# Patient Record
Sex: Female | Born: 1990
Health system: Southern US, Community
[De-identification: ages and names within clinical notes are randomized; demographics above are authoritative.]

## PROBLEM LIST (undated history)

## (undated) ENCOUNTER — Inpatient Hospital Stay (HOSPITAL_COMMUNITY): Payer: Self-pay

## (undated) DIAGNOSIS — F32A Depression, unspecified: Secondary | ICD-10-CM

## (undated) DIAGNOSIS — E079 Disorder of thyroid, unspecified: Secondary | ICD-10-CM

## (undated) DIAGNOSIS — F329 Major depressive disorder, single episode, unspecified: Secondary | ICD-10-CM

## (undated) DIAGNOSIS — F411 Generalized anxiety disorder: Secondary | ICD-10-CM

## (undated) HISTORY — DX: Disorder of thyroid, unspecified: E07.9

## (undated) HISTORY — PX: WISDOM TOOTH EXTRACTION: SHX21

## (undated) HISTORY — DX: Generalized anxiety disorder: F41.1

## (undated) HISTORY — DX: Major depressive disorder, single episode, unspecified: F32.9

---
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
## 2011-01-16 ENCOUNTER — Encounter: Payer: Self-pay | Admitting: Internal Medicine

## 2011-01-17 ENCOUNTER — Encounter: Payer: Self-pay | Admitting: Internal Medicine

## 2011-01-17 ENCOUNTER — Ambulatory Visit (INDEPENDENT_AMBULATORY_CARE_PROVIDER_SITE_OTHER): Payer: 59 | Admitting: Internal Medicine

## 2011-01-17 VITALS — BP 118/74 | Temp 98.2°F | Ht 65.5 in | Wt 164.0 lb

## 2011-01-17 DIAGNOSIS — F411 Generalized anxiety disorder: Secondary | ICD-10-CM

## 2011-01-17 DIAGNOSIS — F329 Major depressive disorder, single episode, unspecified: Secondary | ICD-10-CM

## 2011-01-17 DIAGNOSIS — F3289 Other specified depressive episodes: Secondary | ICD-10-CM

## 2011-01-17 DIAGNOSIS — F410 Panic disorder [episodic paroxysmal anxiety] without agoraphobia: Secondary | ICD-10-CM

## 2011-01-17 MED ORDER — ALPRAZOLAM 0.25 MG PO TABS: 0.2500 mg | ORAL_TABLET | Freq: Three times a day (TID) | ORAL | Status: DC | PRN

## 2011-01-17 NOTE — Assessment & Plan Note (Signed)
Anxiety-patient has a chronic anxiety disorder and now episodes of a panic disorder.  Will continue her SSRI and refer for further psychiatric evaluation, treatment, and cognitive behavior modification.  Will place on short-acting Xanax until psychiatric assessment

## 2011-01-17 NOTE — Patient Instructions (Signed)
Follow behavioral health referral as discussed Call or return to clinic prn if these symptoms worsen or fail to improve as anticipated.

## 2011-01-17 NOTE — Progress Notes (Signed)
  Subjective:    Patient ID: Leah Perez, female    DOB: 01/25/1991, 20 y.o.   MRN: [id number]  Anxiety Symptoms include decreased concentration. Patient reports no confusion.      20 year old patient who is seen today for follow-up.  She has a history of anxiety, depression, and was evaluated and treated at the [facility] in 2008.  At that time, she had situational depression from the death of her best friend.  She was placed on and  Ambilify At that time, and had considerable weight gain.  She has a history of chronic anxiety, which has worsened over the past 3 to 4 weeks.  Three months ago.  She presented to the emergency department after an acute panic attack and her sertraline dose was increased to 100 mg daily.  At the present time.  She denies any depression.  She describes chronic anxiety and panic attacks usually occur about 3 o'clock in the morning and interfere with sleep.  She denies any situational stressors.  She does describe that time, racing thoughts, and inability to focus Review of Systems  Psychiatric/Behavioral: Positive for decreased concentration. Negative for hallucinations, behavioral problems, confusion, dysphoric mood and agitation.       Objective:   Physical Exam  Constitutional: She is oriented to person, place, and time. She appears well-developed and well-nourished. No distress.  Neurological: She is alert and oriented to person, place, and time.  Psychiatric: She has a normal mood and affect. Her behavior is normal. Judgment and thought content normal.       Very upbeat smiling hyperactive          Assessment & Plan:

## 2011-02-28 ENCOUNTER — Other Ambulatory Visit: Payer: Self-pay | Admitting: Internal Medicine

## 2011-04-19 ENCOUNTER — Telehealth: Payer: Self-pay | Admitting: Internal Medicine

## 2011-04-19 MED ORDER — ALPRAZOLAM 0.25 MG PO TABS: 0.2500 mg | ORAL_TABLET | Freq: Three times a day (TID) | ORAL | Status: DC | PRN

## 2011-04-19 MED ORDER — SERTRALINE HCL 50 MG PO TABS: 50.0000 mg | ORAL_TABLET | Freq: Every day | ORAL | Status: DC

## 2011-04-19 NOTE — Telephone Encounter (Signed)
Pt out of sertraline (ZOLOFT) 50 MG tablet and Alprazolam .25 mg. Pt is req these be called in to [facility] on [address]. Pt said that pharmacy has already sent a refill req on both of these and have not rcvd response.

## 2011-07-01 ENCOUNTER — Ambulatory Visit: Payer: 59 | Admitting: Internal Medicine

## 2011-07-02 ENCOUNTER — Encounter: Payer: Self-pay | Admitting: Internal Medicine

## 2011-07-02 ENCOUNTER — Ambulatory Visit (INDEPENDENT_AMBULATORY_CARE_PROVIDER_SITE_OTHER): Payer: 59 | Admitting: Internal Medicine

## 2011-07-02 DIAGNOSIS — Z8669 Personal history of other diseases of the nervous system and sense organs: Secondary | ICD-10-CM

## 2011-07-02 DIAGNOSIS — F411 Generalized anxiety disorder: Secondary | ICD-10-CM

## 2011-07-02 MED ORDER — ALPRAZOLAM 0.25 MG PO TABS: 0.2500 mg | ORAL_TABLET | Freq: Three times a day (TID) | ORAL | Status: DC | PRN

## 2011-07-02 MED ORDER — SUMATRIPTAN SUCCINATE 100 MG PO TABS: 100.0000 mg | ORAL_TABLET | Freq: Once | ORAL | Status: DC | PRN

## 2011-07-02 MED ORDER — SERTRALINE HCL 100 MG PO TABS: 100.0000 mg | ORAL_TABLET | Freq: Every day | ORAL | Status: DC

## 2011-07-02 NOTE — Progress Notes (Signed)
  Subjective:    Patient ID: Leah Perez, female    DOB: 07-11-1991, 20 y.o.   MRN: [id number]  HPI  20 year old patient who presents with a month and a half history of worsening headaches. She describes severe occipital headaches that become more generalized to the frontal area they're described as a severe throbbing sensation grade 7/10 pain scale. They're associated with nausea vomiting photophobia and phonophobia. She states they often last 2-3 days. No prior history of migraine headaches she does admit to increased situational stress especially work related Review of Systems  Constitutional: Negative.   HENT: Negative for hearing loss, congestion, sore throat, rhinorrhea, dental problem, sinus pressure and tinnitus.   Eyes: Negative for pain, discharge and visual disturbance.  Respiratory: Negative for cough and shortness of breath.   Cardiovascular: Negative for chest pain, palpitations and leg swelling.  Gastrointestinal: Positive for nausea and vomiting. Negative for abdominal pain, diarrhea, constipation, blood in stool and abdominal distention.  Genitourinary: Negative for dysuria, urgency, frequency, hematuria, flank pain, vaginal bleeding, vaginal discharge, difficulty urinating, vaginal pain and pelvic pain.  Musculoskeletal: Negative for joint swelling, arthralgias and gait problem.  Skin: Negative for rash.  Neurological: Positive for headaches. Negative for dizziness, syncope, speech difficulty, weakness and numbness.  Hematological: Negative for adenopathy.  Psychiatric/Behavioral: Negative for behavioral problems, dysphoric mood and agitation. The patient is not nervous/anxious.        Objective:   Physical Exam  Constitutional: She is oriented to person, place, and time. She appears well-developed and well-nourished. No distress.  HENT:  Head: Normocephalic.  Right Ear: External ear normal.  Mouth/Throat: Oropharynx is clear and moist.  Eyes: Conjunctivae are normal.  Pupils are equal, round, and reactive to light.       Fundi normal  Neurological: She is alert and oriented to person, place, and time. She has normal reflexes. No cranial nerve deficit.          Assessment & Plan:   Migraine headaches. Will give a trial of Imitrex. We'll increase her sertraline to 100 mg daily. Will recheck in 4 weeks. Stress management encouraged she'll consider behavioral health referral

## 2011-07-02 NOTE — Patient Instructions (Signed)
Return in one month for follow-up  

## 2011-08-05 ENCOUNTER — Ambulatory Visit: Payer: 59 | Admitting: Internal Medicine

## 2011-08-07 ENCOUNTER — Ambulatory Visit (INDEPENDENT_AMBULATORY_CARE_PROVIDER_SITE_OTHER): Payer: 59 | Admitting: Internal Medicine

## 2011-08-07 ENCOUNTER — Encounter: Payer: Self-pay | Admitting: Internal Medicine

## 2011-08-07 DIAGNOSIS — F329 Major depressive disorder, single episode, unspecified: Secondary | ICD-10-CM

## 2011-08-07 DIAGNOSIS — Z8669 Personal history of other diseases of the nervous system and sense organs: Secondary | ICD-10-CM

## 2011-08-07 MED ORDER — FLUTICASONE PROPIONATE 50 MCG/ACT NA SUSP: 1.0000 | Freq: Every day | NASAL | Status: DC

## 2011-08-07 NOTE — Patient Instructions (Addendum)
Call or return to clinic prn if these symptoms worsen or fail to improve as anticipated.  These nasal spray as directed  Consider Alavert Allegra Zyrtec  daily for allergy-related symptoms

## 2011-08-07 NOTE — Progress Notes (Signed)
  Subjective:    Patient ID: Leah Perez, female    DOB: 04-30-1991, 20 y.o.   MRN: [id number]  HPI  20 year old patient who is seen today for followup. She was seen one month ago with worsening migraine headaches as well as worsening stress and anxiety. She is receiving counseling and has done much better with her stress. Her headaches have dramatically improved. She presently is on a sertraline dose of 100 mg. She is responding very nicely to Imitrex which she has taken much earlier in the headache interval and doing quite well. She is extremely pleased with her current status. She is having some allergic rhinitis type symptoms but otherwise doing quite well    Review of Systems  HENT: Positive for congestion and rhinorrhea.   Neurological: Positive for headaches.       Objective:   Physical Exam  Constitutional: She appears well-developed and well-nourished. No distress.       Pleasant no acute distress  Psychiatric: Her behavior is normal.          Assessment & Plan:   Migraine headaches much improved we'll continue present regimen Situational stress also much improved we'll continue present regimen and counseling which has been extremely helpful will return here when necessary

## 2011-08-08 ENCOUNTER — Other Ambulatory Visit: Payer: Self-pay | Admitting: Internal Medicine

## 2011-08-08 NOTE — Telephone Encounter (Signed)
Pt was in 8/29. States Dr. Kirtland Bouchard was to refill all her meds, but he did not. She is leaving - must be at airport at 10 and needs all of them called in to [facility] on [address].

## 2011-08-08 NOTE — Telephone Encounter (Signed)
Spoke with pt - pharmacy not open till 9am - should have refill on all meds - if not - have [facility] call and I will ok . If not , locate pharmacy in [place] and I cal call refilled in there . KIK

## 2011-08-13 ENCOUNTER — Other Ambulatory Visit: Payer: Self-pay | Admitting: Internal Medicine

## 2011-10-25 ENCOUNTER — Ambulatory Visit (INDEPENDENT_AMBULATORY_CARE_PROVIDER_SITE_OTHER): Payer: 59 | Admitting: Internal Medicine

## 2011-10-25 ENCOUNTER — Encounter: Payer: Self-pay | Admitting: Internal Medicine

## 2011-10-25 VITALS — BP 110/78 | Temp 97.8°F | Wt 170.0 lb

## 2011-10-25 DIAGNOSIS — F329 Major depressive disorder, single episode, unspecified: Secondary | ICD-10-CM

## 2011-10-25 DIAGNOSIS — F411 Generalized anxiety disorder: Secondary | ICD-10-CM

## 2011-10-25 DIAGNOSIS — Z8669 Personal history of other diseases of the nervous system and sense organs: Secondary | ICD-10-CM

## 2011-10-25 DIAGNOSIS — Z0000 Encounter for general adult medical examination without abnormal findings: Secondary | ICD-10-CM

## 2011-10-25 DIAGNOSIS — F3289 Other specified depressive episodes: Secondary | ICD-10-CM

## 2011-10-25 DIAGNOSIS — Z23 Encounter for immunization: Secondary | ICD-10-CM

## 2011-10-25 MED ORDER — ALPRAZOLAM 0.5 MG PO TABS: 0.5000 mg | ORAL_TABLET | Freq: Three times a day (TID) | ORAL | Status: DC | PRN

## 2011-10-25 NOTE — Progress Notes (Signed)
  Subjective:    Patient ID: Leah Perez, female    DOB: 10/14/1991, 20 y.o.   MRN: [id number]  HPI  20 year old patient who has a history of anxiety depression. She has been followed at the [facility] and has done quite well. Her psychologist feels that she might benefit from a slightly higher dose of alprazolam. She takes this rarely only twice per week. She is on sertraline 100 mg daily and feels that she is doing quite well as far as her anxiety stress and depression. Her migraines and also decreased dramatically in frequency and she is quite pleased with the progress there.    Review of Systems  Neurological: Positive for headaches (improved).  Psychiatric/Behavioral: Positive for dysphoric mood (improved). The patient is nervous/anxious ( improved).        Objective:   Physical Exam  Constitutional: She appears well-developed and well-nourished. No distress.  Psychiatric: She has a normal mood and affect. Her behavior is normal. Judgment and thought content normal.          Assessment & Plan:

## 2011-10-25 NOTE — Patient Instructions (Signed)
It is important that you exercise regularly, at least 20 minutes 3 to 4 times per week.  If you develop chest pain or shortness of breath seek  medical attention.  Return in 6 months for follow-up  

## 2011-12-15 ENCOUNTER — Emergency Department (HOSPITAL_COMMUNITY)
Admission: EM | Admit: 2011-12-15 | Discharge: 2011-12-15 | Disposition: A | Discharge status: Home / Self Care | Payer: 59 | Attending: Emergency Medicine | Admitting: Emergency Medicine

## 2011-12-15 DIAGNOSIS — R059 Cough, unspecified: Secondary | ICD-10-CM | POA: Insufficient documentation

## 2011-12-15 DIAGNOSIS — Z79899 Other long term (current) drug therapy: Secondary | ICD-10-CM | POA: Insufficient documentation

## 2011-12-15 DIAGNOSIS — H5710 Ocular pain, unspecified eye: Secondary | ICD-10-CM | POA: Insufficient documentation

## 2011-12-15 DIAGNOSIS — IMO0001 Reserved for inherently not codable concepts without codable children: Secondary | ICD-10-CM | POA: Insufficient documentation

## 2011-12-15 DIAGNOSIS — R5381 Other malaise: Secondary | ICD-10-CM | POA: Insufficient documentation

## 2011-12-15 DIAGNOSIS — R0982 Postnasal drip: Secondary | ICD-10-CM | POA: Insufficient documentation

## 2011-12-15 DIAGNOSIS — R112 Nausea with vomiting, unspecified: Secondary | ICD-10-CM | POA: Insufficient documentation

## 2011-12-15 DIAGNOSIS — H9209 Otalgia, unspecified ear: Secondary | ICD-10-CM | POA: Insufficient documentation

## 2011-12-15 DIAGNOSIS — R509 Fever, unspecified: Secondary | ICD-10-CM | POA: Insufficient documentation

## 2011-12-15 DIAGNOSIS — F17200 Nicotine dependence, unspecified, uncomplicated: Secondary | ICD-10-CM | POA: Insufficient documentation

## 2011-12-15 DIAGNOSIS — J329 Chronic sinusitis, unspecified: Secondary | ICD-10-CM | POA: Insufficient documentation

## 2011-12-15 DIAGNOSIS — R070 Pain in throat: Secondary | ICD-10-CM | POA: Insufficient documentation

## 2011-12-15 DIAGNOSIS — R05 Cough: Secondary | ICD-10-CM | POA: Insufficient documentation

## 2011-12-15 DIAGNOSIS — R51 Headache: Secondary | ICD-10-CM | POA: Insufficient documentation

## 2011-12-15 MED ORDER — CETIRIZINE-PSEUDOEPHEDRINE ER 5-120 MG PO TB12: 1.0000 | ORAL_TABLET | Freq: Every day | ORAL | Status: DC

## 2011-12-15 MED ORDER — PROMETHAZINE HCL 25 MG/ML IJ SOLN
25.0000 mg | Freq: Once | INTRAMUSCULAR | Status: AC
  Administered 2011-12-15: 25 mg via INTRAMUSCULAR
  Filled 2011-12-15: qty 1

## 2011-12-15 MED ORDER — OXYMETAZOLINE HCL 0.05 % NA SOLN
2.0000 | Freq: Once | NASAL | Status: AC
  Administered 2011-12-15: 2 via NASAL
  Filled 2011-12-15: qty 15

## 2011-12-15 MED ORDER — KETOROLAC TROMETHAMINE 60 MG/2ML IM SOLN
60.0000 mg | Freq: Once | INTRAMUSCULAR | Status: AC
  Administered 2011-12-15: 60 mg via INTRAMUSCULAR
  Filled 2011-12-15: qty 2

## 2011-12-15 NOTE — ED Notes (Signed)
Pt is able to keep down food and fluids, denies fever but states has the chills at times. Pt states her whole body just aches and hurts really bad. Pt in no distress.

## 2011-12-15 NOTE — ED Notes (Signed)
Pt in with c/o headache body aches and flu-like sx x1 week pt states a hx of migraines and no relief with otc medications also c/o vomiting with nausea

## 2011-12-15 NOTE — ED Provider Notes (Signed)
History     CSN: [id number]  Arrival date & time 12/15/11  1304   First MD Initiated Contact with Patient 12/15/11 1506      Chief Complaint  Patient presents with  . Migraine    (Consider location/radiation/quality/duration/timing/severity/associated sxs/prior treatment) Patient is a 21 y.o. female presenting with URI. The history is provided by the patient.  URI The primary symptoms include fever, fatigue, headaches, ear pain, cough, nausea, vomiting and myalgias. Primary symptoms do not include sore throat, abdominal pain or rash. The current episode started 6 to 7 days ago. This is a new problem. The problem has been gradually worsening.  The fever began 6 to 7 days ago. The fever has been gradually improving since its onset. The maximum temperature recorded prior to her arrival was unknown.  The headache began more than 2 days ago. The pain from the headache is at a severity of 8/10. The headache is associated with eye pain. The headache is not associated with photophobia, double vision, decreased vision, stiff neck, neck stiffness or weakness.  The myalgias are not associated with weakness.   Symptoms associated with the illness include chills and congestion.  Pt states it started as nasal congestion, fever, body aches, sore throat, cough. States now everything improving except for sinus pressure, nasal congestion, headache, myalgias. States feels like these symptoms are worsening. She is unable to sleep at night. States has been taking zyrtec, Claritin, dayquil, nyquil, states not improving. Denies neck pain or stiffness. Denies chest pain, abdominal pain, rash.  Past Medical History  Diagnosis Date  . Migraine     History reviewed. No pertinent past surgical history.  No family history on file.  History  Substance Use Topics  . Smoking status: Current Some Day Smoker  . Smokeless tobacco: Not on file  . Alcohol Use: Yes    OB History    Grav Para Term Preterm  Abortions TAB SAB Ect Mult Living                  Review of Systems  Constitutional: Positive for fever, chills and fatigue.  HENT: Positive for ear pain, congestion and postnasal drip. Negative for sore throat, neck pain and neck stiffness.   Eyes: Positive for pain. Negative for double vision and photophobia.  Respiratory: Positive for cough.   Cardiovascular: Negative.   Gastrointestinal: Positive for nausea and vomiting. Negative for abdominal pain.  Genitourinary: Negative.   Musculoskeletal: Positive for myalgias.  Skin: Negative for rash.  Neurological: Positive for headaches. Negative for weakness.  Psychiatric/Behavioral: Negative.     Allergies  Review of patient's allergies indicates no known allergies.  Home Medications   Current Outpatient Rx  Name Route Sig Dispense Refill  . ALPRAZOLAM 0.5 MG PO TABS Oral Take 0.5 mg by mouth 3 (three) times daily as needed. For anxiety.     Marland Kitchen VITAMIN C PO Oral Take 1 tablet by mouth daily.      Marland Kitchen VITAMIN D 1000 UNITS PO TABS Oral Take 1,000 Units by mouth daily.      . NYQUIL PO Oral Take 2 capsules by mouth every 4 (four) hours as needed. For cold symptoms.     . DAYQUIL PO Oral Take 2 capsules by mouth every 4 (four) hours as needed. For cold symptoms.     . SERTRALINE HCL 100 MG PO TABS Oral Take 100 mg by mouth daily.      . SUMATRIPTAN SUCCINATE 100 MG PO TABS Oral Take 100  mg by mouth every 2 (two) hours as needed.        BP 126/77  Pulse 91  Temp(Src) 98 F (36.7 C) (Oral)  Resp 18  SpO2 100%  LMP 12/08/2011  Physical Exam  Nursing note and vitals reviewed. Constitutional: She is oriented to person, place, and time. She appears well-developed and well-nourished. No distress.  HENT:  Head: Normocephalic.  Right Ear: Hearing, external ear and ear canal normal. No tenderness. Tympanic membrane is not erythematous.  Left Ear: Hearing, external ear and ear canal normal. No tenderness. Tympanic membrane is not  erythematous.  Nose: Rhinorrhea present. Right sinus exhibits maxillary sinus tenderness and frontal sinus tenderness. Left sinus exhibits maxillary sinus tenderness and frontal sinus tenderness.  Mouth/Throat: Uvula is midline, oropharynx is clear and moist and mucous membranes are normal. No oropharyngeal exudate or posterior oropharyngeal erythema.       Fluid behind TM bilaterally  Eyes: Conjunctivae are normal.  Neck: Normal range of motion. Neck supple.       No meningismus  Cardiovascular: Normal rate, regular rhythm and normal heart sounds.   Pulmonary/Chest: Effort normal and breath sounds normal. No respiratory distress.  Abdominal: Soft. Bowel sounds are normal. There is no tenderness.  Musculoskeletal: Normal range of motion.  Neurological: She is alert and oriented to person, place, and time.  Skin: Skin is warm and dry. No erythema.  Psychiatric: She has a normal mood and affect.    ED Course  Procedures (including critical care time)  Pt with URI symptoms, congestion. States triggered her migraine. VS normal. Pt in NAD. Will try toradol, phenergan. Pt is well appearing, non toxic. Suspect headache is actually from sinus pressure. Will try decongestants, and follow up  4:10 PM Pt slightly improved with toradol and phenergan. Will d/c home with PCP follow up.  MDM          Lottie Mussel, PA 12/15/11 [id number]

## 2011-12-16 NOTE — ED Provider Notes (Signed)
Medical screening examination/treatment/procedure(s) were performed by non-physician practitioner and as supervising physician I was immediately available for consultation/collaboration.  Karey Suthers, MD 12/16/11 0119 

## 2012-01-09 ENCOUNTER — Telehealth: Payer: Self-pay | Admitting: Internal Medicine

## 2012-01-09 MED ORDER — SERTRALINE HCL 100 MG PO TABS: 100.0000 mg | ORAL_TABLET | Freq: Every day | ORAL | Status: DC

## 2012-01-09 NOTE — Telephone Encounter (Signed)
ok 

## 2012-01-09 NOTE — Telephone Encounter (Signed)
Pt last seen 10/25/2011. Pls advise.

## 2012-01-09 NOTE — Telephone Encounter (Signed)
Rx sent to pharmacy.  Left a message for pt to return call.  

## 2012-01-09 NOTE — Telephone Encounter (Signed)
Pt is req a partial refill of sertraline (ZOLOFT) 100 MG tablet #16 to last until her refill on 01/25/12 is due. Pt said that she lost her medication.

## 2012-01-27 ENCOUNTER — Other Ambulatory Visit: Payer: Self-pay | Admitting: Internal Medicine

## 2012-01-27 NOTE — Telephone Encounter (Signed)
Called in.

## 2012-01-27 NOTE — Telephone Encounter (Signed)
Last seen 10/25/11  Last written 10/25/11  # [phone] RF  Please advise

## 2012-01-27 NOTE — Telephone Encounter (Signed)
ok 

## 2012-02-04 ENCOUNTER — Encounter: Payer: Self-pay | Admitting: Internal Medicine

## 2012-02-04 ENCOUNTER — Ambulatory Visit (INDEPENDENT_AMBULATORY_CARE_PROVIDER_SITE_OTHER): Payer: 59 | Admitting: Internal Medicine

## 2012-02-04 VITALS — BP 100/62 | Temp 98.6°F | Wt 163.0 lb

## 2012-02-04 DIAGNOSIS — J069 Acute upper respiratory infection, unspecified: Secondary | ICD-10-CM

## 2012-02-04 MED ORDER — HYDROCODONE-HOMATROPINE 5-1.5 MG/5ML PO SYRP: 5.0000 mL | ORAL_SOLUTION | Freq: Four times a day (QID) | ORAL | Status: AC | PRN

## 2012-02-04 NOTE — Patient Instructions (Signed)
Get plenty of rest, Drink lots of  clear liquids, and use Tylenol or ibuprofen for fever and discomfort.    Mucinex DM  one twice daily  Call or return to clinic prn if these symptoms worsen or fail to improve as anticipated.

## 2012-02-04 NOTE — Progress Notes (Signed)
  Subjective:    Patient ID: Leah Perez, female    DOB: [birth year], 21 y.o.   MRN: [id number]  HPI  21 year old patient who has a history of allergic rhinitis she successfully discontinued tobacco use approximately one month ago. For the past week she has had increasing cough which has been non-productive. Cough has been refractory and interfering with sleep. She has developed some chest wall discomfort. There's been no wheezing fever or sputum production    Review of Systems  Respiratory: Positive for cough and chest tightness.        Objective:   Physical Exam  Constitutional: She is oriented to person, place, and time. She appears well-developed and well-nourished.  HENT:  Head: Normocephalic.  Right Ear: External ear normal.  Left Ear: External ear normal.  Mouth/Throat: Oropharynx is clear and moist.  Eyes: Conjunctivae and EOM are normal. Pupils are equal, round, and reactive to light.  Neck: Normal range of motion. Neck supple. No thyromegaly present.  Cardiovascular: Normal rate, regular rhythm, normal heart sounds and intact distal pulses.   Pulmonary/Chest: Effort normal and breath sounds normal. No respiratory distress. She has no wheezes. She has no rales. She exhibits no tenderness.  Abdominal: Soft. Bowel sounds are normal. She exhibits no mass. There is no tenderness.  Musculoskeletal: Normal range of motion.  Lymphadenopathy:    She has no cervical adenopathy.  Neurological: She is alert and oriented to person, place, and time.  Skin: Skin is warm and dry. No rash noted.  Psychiatric: She has a normal mood and affect. Her behavior is normal.          Assessment & Plan:    Viral URI with cough. We'll treat symptomatically We'll call if not improved

## 2012-03-23 ENCOUNTER — Encounter: Payer: Self-pay | Admitting: Internal Medicine

## 2012-03-23 ENCOUNTER — Ambulatory Visit: Payer: Self-pay | Admitting: Internal Medicine

## 2012-03-23 ENCOUNTER — Ambulatory Visit (INDEPENDENT_AMBULATORY_CARE_PROVIDER_SITE_OTHER): Payer: 59 | Admitting: Internal Medicine

## 2012-03-23 VITALS — BP 120/86 | Temp 97.7°F | Wt 159.0 lb

## 2012-03-23 DIAGNOSIS — F411 Generalized anxiety disorder: Secondary | ICD-10-CM

## 2012-03-23 DIAGNOSIS — F329 Major depressive disorder, single episode, unspecified: Secondary | ICD-10-CM

## 2012-03-23 NOTE — Patient Instructions (Signed)
Behavioral health referral as discussed  Return in 3 months

## 2012-03-23 NOTE — Progress Notes (Signed)
  Subjective:    Patient ID: Leah Perez, female    DOB: [birth year], 21 y.o.   MRN: [id number]  HPI 21 year old patient who has a history of anxiety depression. She has done quite well on sertraline and when necessary alprazolam.  More recently she has had significant situational stress and has become quite overwhelmed.  She has been much more stressed and anxious and feels that she is at her breaking point. No suicide ideation.    Review of Systems  Psychiatric/Behavioral: Positive for sleep disturbance and decreased concentration. The patient is nervous/anxious.        Objective:   Physical Exam  Constitutional: She appears well-developed and well-nourished. No distress.  Psychiatric: Judgment and thought content normal.       Quite anxious          Assessment & Plan:   Anxiety depression Situational stress  We'll set up for CBT and continue present regimen

## 2012-04-02 ENCOUNTER — Other Ambulatory Visit: Payer: Self-pay | Admitting: Internal Medicine

## 2012-04-15 ENCOUNTER — Ambulatory Visit (INDEPENDENT_AMBULATORY_CARE_PROVIDER_SITE_OTHER): Payer: 59 | Admitting: Internal Medicine

## 2012-04-15 ENCOUNTER — Encounter: Payer: Self-pay | Admitting: Internal Medicine

## 2012-04-15 VITALS — BP 124/84 | Temp 98.7°F | Wt 156.0 lb

## 2012-04-15 DIAGNOSIS — F909 Attention-deficit hyperactivity disorder, unspecified type: Secondary | ICD-10-CM

## 2012-04-15 MED ORDER — LISDEXAMFETAMINE DIMESYLATE 50 MG PO CAPS: 50.0000 mg | ORAL_CAPSULE | ORAL | Status: DC

## 2012-04-15 MED ORDER — AMPHETAMINE-DEXTROAMPHETAMINE 10 MG PO TABS: 10.0000 mg | ORAL_TABLET | Freq: Every day | ORAL | Status: DC

## 2012-04-15 MED ORDER — AMPHETAMINE-DEXTROAMPHET ER 20 MG PO CP24: 20.0000 mg | ORAL_CAPSULE | ORAL | Status: DC

## 2012-04-15 NOTE — Patient Instructions (Signed)
Return in one month for follow-up  

## 2012-04-15 NOTE — Progress Notes (Signed)
  Subjective:    Patient ID: Leah Perez, female    DOB: [birth year], 21 y.o.   MRN: [id number]  HPI  21 year old patient who has a long history of ADD which he states was diagnosed in third grade. Her mother has been resistant to medications but more recently the patient has felt quite impaired with her job with decreased focus and easy distractibility. She did use a friend's Vyvanse 50 and had a very traumatic and good clinical response    Review of Systems  Psychiatric/Behavioral: Positive for decreased concentration.       Objective:   Physical Exam  Constitutional: She appears well-developed and well-nourished. No distress.  Psychiatric: She has a normal mood and affect. Her behavior is normal. Judgment and thought content normal.          Assessment & Plan:   ADHD. We'll give a prescription for both Vyvanse as well as generic Adderall which she will price. She will return in 4 weeks for followup. She has been asked to read "driven to distraction".

## 2012-04-24 ENCOUNTER — Other Ambulatory Visit: Payer: Self-pay | Admitting: Internal Medicine

## 2012-05-20 ENCOUNTER — Other Ambulatory Visit: Payer: Self-pay | Admitting: Internal Medicine

## 2012-05-21 ENCOUNTER — Telehealth: Payer: Self-pay | Admitting: Internal Medicine

## 2012-05-21 ENCOUNTER — Other Ambulatory Visit: Payer: Self-pay | Admitting: Internal Medicine

## 2012-05-21 MED ORDER — AMPHETAMINE-DEXTROAMPHET ER 20 MG PO CP24: 20.0000 mg | ORAL_CAPSULE | ORAL | Status: DC

## 2012-05-21 MED ORDER — AMPHETAMINE-DEXTROAMPHETAMINE 10 MG PO TABS: 10.0000 mg | ORAL_TABLET | Freq: Every day | ORAL | Status: DC

## 2012-05-21 NOTE — Telephone Encounter (Signed)
rx's done and ready for pick up

## 2012-05-21 NOTE — Telephone Encounter (Signed)
You had only give 30 day rx x1 on both strengths in may when into be seen for focus issues - please advise

## 2012-05-21 NOTE — Telephone Encounter (Signed)
Pt called req refills for amphetamine-dextroamphetamine (ADDERALL XR) 20 MG 24 hr capsule and amphetamine-dextroamphetamine (ADDERALL, 10MG ,) 10 MG tablet.

## 2012-05-21 NOTE — Telephone Encounter (Signed)
Pt had called and req a refill of amphetamine-dextroamphetamine (ADDERALL XR) 20 MG 24 hr capsule and amphetamine-dextroamphetamine (ADDERALL, 10MG ,) 10 MG tablet this morning.

## 2012-05-21 NOTE — Telephone Encounter (Signed)
ok 

## 2012-05-21 NOTE — Telephone Encounter (Signed)
Called number given - a man ans. - LMTCB   Called cell# listed in chart - VM - LMTCB need to now what to refills - has 2 different adderalls and 1 vyvansne listed- want to make sure to fill what she needs

## 2012-06-24 ENCOUNTER — Telehealth: Payer: Self-pay | Admitting: Internal Medicine

## 2012-06-24 MED ORDER — ALPRAZOLAM 0.5 MG PO TABS: 0.5000 mg | ORAL_TABLET | Freq: Three times a day (TID) | ORAL | Status: DC | PRN

## 2012-06-24 NOTE — Telephone Encounter (Signed)
Pt requesting refill on ALPRAZolam (XANAX) 0.5 MG tablet  ° °

## 2012-07-15 ENCOUNTER — Emergency Department (HOSPITAL_COMMUNITY): Payer: No Typology Code available for payment source

## 2012-07-15 ENCOUNTER — Encounter (HOSPITAL_COMMUNITY): Payer: Self-pay | Admitting: Emergency Medicine

## 2012-07-15 ENCOUNTER — Emergency Department (HOSPITAL_COMMUNITY)
Admission: EM | Admit: 2012-07-15 | Discharge: 2012-07-15 | Disposition: A | Discharge status: Home / Self Care | Payer: No Typology Code available for payment source | Attending: Emergency Medicine | Admitting: Emergency Medicine

## 2012-07-15 DIAGNOSIS — M542 Cervicalgia: Secondary | ICD-10-CM | POA: Insufficient documentation

## 2012-07-15 DIAGNOSIS — R079 Chest pain, unspecified: Secondary | ICD-10-CM | POA: Insufficient documentation

## 2012-07-15 DIAGNOSIS — T1490XA Injury, unspecified, initial encounter: Secondary | ICD-10-CM | POA: Insufficient documentation

## 2012-07-15 DIAGNOSIS — R51 Headache: Secondary | ICD-10-CM | POA: Insufficient documentation

## 2012-07-15 DIAGNOSIS — M25559 Pain in unspecified hip: Secondary | ICD-10-CM | POA: Insufficient documentation

## 2012-07-15 DIAGNOSIS — M25569 Pain in unspecified knee: Secondary | ICD-10-CM | POA: Insufficient documentation

## 2012-07-15 HISTORY — DX: Depression, unspecified: F32.A

## 2012-07-15 HISTORY — DX: Major depressive disorder, single episode, unspecified: F32.9

## 2012-07-15 LAB — URINALYSIS, ROUTINE W REFLEX MICROSCOPIC
Glucose, UA: NEGATIVE mg/dL
Ketones, ur: NEGATIVE mg/dL
Protein, ur: NEGATIVE mg/dL

## 2012-07-15 LAB — URINE MICROSCOPIC-ADD ON

## 2012-07-15 MED ORDER — DIAZEPAM 5 MG PO TABS: 5.0000 mg | ORAL_TABLET | Freq: Every day | ORAL | Status: AC

## 2012-07-15 MED ORDER — ACETAMINOPHEN 325 MG PO TABS
650.0000 mg | ORAL_TABLET | Freq: Once | ORAL | Status: AC
  Administered 2012-07-15: 650 mg via ORAL
  Filled 2012-07-15: qty 2

## 2012-07-15 MED ORDER — IBUPROFEN 600 MG PO TABS: 600.0000 mg | ORAL_TABLET | Freq: Three times a day (TID) | ORAL | Status: AC
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
## 2012-07-15 NOTE — ED Notes (Signed)
Jeraldine Loots MD stated that labs were not necessary.
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
## 2012-07-27 ENCOUNTER — Other Ambulatory Visit: Payer: Self-pay | Admitting: Internal Medicine

## 2012-09-01 ENCOUNTER — Other Ambulatory Visit: Payer: Self-pay | Admitting: Internal Medicine

## 2012-09-01 MED ORDER — ALPRAZOLAM 0.5 MG PO TABS: 0.5000 mg | ORAL_TABLET | Freq: Three times a day (TID) | ORAL | Status: DC | PRN

## 2012-09-01 MED ORDER — AMPHETAMINE-DEXTROAMPHETAMINE 10 MG PO TABS: 10.0000 mg | ORAL_TABLET | Freq: Every day | ORAL | Status: DC

## 2012-09-01 MED ORDER — AMPHETAMINE-DEXTROAMPHET ER 20 MG PO CP24: 20.0000 mg | ORAL_CAPSULE | ORAL | Status: DC

## 2012-09-01 MED ORDER — SUMATRIPTAN SUCCINATE 100 MG PO TABS: 100.0000 mg | ORAL_TABLET | ORAL | Status: DC | PRN

## 2012-09-01 MED ORDER — AMPHETAMINE-DEXTROAMPHETAMINE 10 MG PO TABS: 10.0000 mg | ORAL_TABLET | Freq: Two times a day (BID) | ORAL | Status: DC

## 2012-09-01 NOTE — Telephone Encounter (Signed)
Attempt to call- rx's ready for pick up - adderalls and xanax - imitrex was escibed - PT must be seen for future refills per Dr. Frederica Kuster needs rov next month - printed 1- 10mg  bid - error - rx shreded. Ready for pick up = 3 adderall XL20mg  qd  , 3 - 10mg  qd , 1 xanax rx

## 2012-09-01 NOTE — Telephone Encounter (Signed)
Patient called stating that she need refills of her adderall, xanax, and imitrex. Please assist.

## 2012-09-01 NOTE — Telephone Encounter (Signed)
It is time for adderall 10mg  and XR20mg   imitrex last written 07/27/12 # 30  0RF Xanax last written 07/27/12 # 30  0Rf Please advise - last seen 04/15/12 - "focus issues"

## 2012-09-01 NOTE — Telephone Encounter (Signed)
Ok to [employer] next month

## 2012-09-07 NOTE — Telephone Encounter (Signed)
Pt called back and I set her up for OV on 10/23. Will be in today to pick up rx.

## 2012-09-09 NOTE — Telephone Encounter (Signed)
Note was on rx envelope to make OV in next two wks. Person who had pt sign for script did not make that appt. Pt's nexta ppt is 10/31. I cannot LMOM for pt as vmail box is full.

## 2012-09-30 ENCOUNTER — Ambulatory Visit: Payer: Self-pay | Admitting: Internal Medicine

## 2012-10-08 ENCOUNTER — Ambulatory Visit (INDEPENDENT_AMBULATORY_CARE_PROVIDER_SITE_OTHER): Payer: 59 | Admitting: Internal Medicine

## 2012-10-08 ENCOUNTER — Encounter: Payer: Self-pay | Admitting: Internal Medicine

## 2012-10-08 VITALS — BP 110/78 | Temp 98.0°F | Wt 145.0 lb

## 2012-10-08 DIAGNOSIS — F909 Attention-deficit hyperactivity disorder, unspecified type: Secondary | ICD-10-CM | POA: Insufficient documentation

## 2012-10-08 DIAGNOSIS — Z8669 Personal history of other diseases of the nervous system and sense organs: Secondary | ICD-10-CM

## 2012-10-08 DIAGNOSIS — F329 Major depressive disorder, single episode, unspecified: Secondary | ICD-10-CM

## 2012-10-08 DIAGNOSIS — F3289 Other specified depressive episodes: Secondary | ICD-10-CM

## 2012-10-08 MED ORDER — AMPHETAMINE-DEXTROAMPHETAMINE 10 MG PO TABS: 10.0000 mg | ORAL_TABLET | Freq: Every day | ORAL | Status: DC

## 2012-10-08 MED ORDER — AMPHETAMINE-DEXTROAMPHET ER 20 MG PO CP24: 20.0000 mg | ORAL_CAPSULE | ORAL | Status: DC

## 2012-10-08 MED ORDER — SUMATRIPTAN SUCCINATE 100 MG PO TABS: 100.0000 mg | ORAL_TABLET | ORAL | Status: DC | PRN

## 2012-10-08 MED ORDER — ALPRAZOLAM 0.5 MG PO TABS: 0.5000 mg | ORAL_TABLET | Freq: Three times a day (TID) | ORAL | Status: DC | PRN

## 2012-10-08 NOTE — Progress Notes (Signed)
  Subjective:    Patient ID: Leah Perez, female    DOB: 07-31-1991, 21 y.o.   MRN: [id number]  HPI  21 year old patient who is seen today for followup. She has a history depression which has been quite stable she is quite pleased with her progress. She has a history of migraines that have also been quiet. She now is on treatment for ADHD and has done remarkably well. She is on an extended release dose of 20 the morning and takes off in an extra 10 mg of immediate release in the afternoon. Late in the evening she has difficult time winding down and often takes alprazolam to assist with sleep. 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   HENT: Negative for hearing loss, congestion, sore throat, rhinorrhea, dental problem, sinus pressure and tinnitus.   Eyes: Negative for pain, discharge and visual disturbance.  Respiratory: Negative for cough and shortness of breath.   Cardiovascular: Negative for chest pain, palpitations and leg swelling.  Gastrointestinal: Negative for nausea, vomiting, abdominal pain, diarrhea, constipation, blood in stool and abdominal distention.  Genitourinary: Negative for dysuria, urgency, frequency, hematuria, flank pain, vaginal bleeding, vaginal discharge, difficulty urinating, vaginal pain and pelvic pain.  Musculoskeletal: Negative for joint swelling, arthralgias and gait problem.  Skin: Negative for rash.  Neurological: Negative for dizziness, syncope, speech difficulty, weakness, numbness and headaches (stable).  Hematological: Negative for adenopathy.  Psychiatric/Behavioral: Negative for behavioral problems, dysphoric mood ( stable) and agitation. The patient is not nervous/anxious.        Objective:   Physical Exam  Constitutional: She appears well-developed and well-nourished. No distress.  Psychiatric: She has a normal mood and affect. Her behavior is normal.  Judgment and thought content normal.          Assessment & Plan:   Migraine headaches stable. No recent use of medications Depression stable. Patient quite pleased with her progress ADHD. Well-controlled on her present regimen. Relaxation techniques discussed and encouraged. Hopeful she can titrate and discontinue alprazolam. May require referral to a behavioral health specialist

## 2012-10-08 NOTE — Patient Instructions (Signed)
It is important that you exercise regularly, at least 20 minutes 3 to 4 times per week.  If you develop chest pain or shortness of breath seek  medical attention.  Return in 6 months for follow-up  

## 2012-10-27 ENCOUNTER — Other Ambulatory Visit: Payer: Self-pay | Admitting: Internal Medicine

## 2012-10-27 NOTE — Telephone Encounter (Signed)
Med denied. Pt needs OV for refills.

## 2012-11-07 ENCOUNTER — Other Ambulatory Visit: Payer: Self-pay | Admitting: Internal Medicine

## 2012-11-30 ENCOUNTER — Encounter: Payer: Self-pay | Admitting: Internal Medicine

## 2012-11-30 ENCOUNTER — Ambulatory Visit (INDEPENDENT_AMBULATORY_CARE_PROVIDER_SITE_OTHER): Payer: 59 | Admitting: Internal Medicine

## 2012-11-30 VITALS — BP 120/80 | HR 76 | Temp 98.3°F | Resp 18 | Wt 140.0 lb

## 2012-11-30 DIAGNOSIS — R300 Dysuria: Secondary | ICD-10-CM

## 2012-11-30 DIAGNOSIS — R109 Unspecified abdominal pain: Secondary | ICD-10-CM

## 2012-11-30 LAB — POCT URINALYSIS DIPSTICK
Bilirubin, UA: NEGATIVE
Glucose, UA: NEGATIVE
Nitrite, UA: NEGATIVE
Spec Grav, UA: <=1.005
Urobilinogen, UA: 0.2

## 2012-11-30 MED ORDER — HYDROCODONE-ACETAMINOPHEN 5-500 MG PO TABS: 1.0000 | ORAL_TABLET | Freq: Three times a day (TID) | ORAL | Status: DC | PRN

## 2012-11-30 MED ORDER — CIPROFLOXACIN HCL 500 MG PO TABS: 500.0000 mg | ORAL_TABLET | Freq: Two times a day (BID) | ORAL | Status: DC

## 2012-11-30 NOTE — Patient Instructions (Signed)
Drink as much fluid as you  can tolerate over the next few daysUreteral Colic (Kidney Stones) Ureteral colic is the result of a condition when kidney stones form inside the kidney. Once kidney stones are formed they may move into the tube that connects the kidney with the bladder (ureter). If this occurs, this condition may cause pain (colic) in the ureter.   CAUSES   Pain is caused by stone movement in the ureter and the obstruction caused by the stone. SYMPTOMS   The pain comes and goes as the ureter contracts around the stone. The pain is usually intense, sharp, and stabbing in character. The location of the pain may move as the stone moves through the ureter. When the stone is near the kidney the pain is usually located in the back and radiates to the belly (abdomen). When the stone is ready to pass into the bladder the pain is often located in the lower abdomen on the side the stone is located. At this location, the symptoms may mimic those of a urinary tract infection with urinary frequency. Once the stone is located here it often passes into the bladder and the pain disappears completely. TREATMENT    Your caregiver will provide you with medicine for pain relief.   You may require specialized follow-up X-rays.   The absence of pain does not always mean that the stone has passed. It may have just stopped moving. If the urine remains completely obstructed, it can cause loss of kidney function or even complete destruction of the involved kidney. It is your responsibility and in your interest that X-rays and follow-ups as suggested by your caregiver are completed. Relief of pain without passage of the stone can be associated with severe damage to the kidney, including loss of kidney function on that side.   If your stone does not pass on its own, additional measures may be taken by your caregiver to ensure its removal.  HOME CARE INSTRUCTIONS    Increase your fluid intake. Water is the preferred  fluid since juices containing vitamin C may acidify the urine making it less likely for certain stones (uric acid stones) to pass.   Strain all urine. A strainer will be provided. Keep all particulate matter or stones for your caregiver to inspect.   Take your pain medicine as directed.   Make a follow-up appointment with your caregiver as directed.   Remember that the goal is passage of your stone. The absence of pain does not mean the stone is gone. Follow your caregiver's instructions.   Only take over-the-counter or prescription medicines for pain, discomfort, or fever as directed by your caregiver.  SEEK MEDICAL CARE IF:    Pain cannot be controlled with the prescribed medicine.   You have a fever.   Pain continues for longer than your caregiver advises it should.   There is a change in the pain, and you develop chest discomfort or constant abdominal pain.   You feel faint or pass out.  MAKE SURE YOU:    Understand these instructions.   Will watch your condition.   Will get help right away if you are not doing well or get worse.  Document Released: 09/04/2005 Document Revised: 02/17/2012 Document Reviewed: 05/22/2011 [facility] Patient Information 2013 [place], [place].

## 2012-11-30 NOTE — Progress Notes (Signed)
Subjective:    Patient ID: Leah Perez, female    DOB: 1991/01/21, 21 y.o.   MRN: [id number]  HPI  21 year old patient who presents with a one week history of the low back and right flank pain. This intensified 4 days ago require her to leave work. She's had some intermittent dysuria. Pain that waxes and wanes but it is there constantly. No real aggravating or relieving factors. Urinalysis revealed moderate hematuria and trace leukocytes. No fever or chills. She states that her insurance benefits have peaked and all medical expenses will be out of pocket and she wishes to avoid  testing if possible  Past Medical History  Diagnosis Date  . ANXIETY 03/06/2009  . DEPRESSION 03/06/2009  . Migraine   . Depression     History   Social History  . Marital Status: Single    Spouse Name: N/A    Number of Children: N/A  . Years of Education: N/A   Occupational History  . Not on file.   Social History Main Topics  . Smoking status: Former Smoker    Quit date: 12/10/2011  . Smokeless tobacco: Never Used  . Alcohol Use: Yes  . Drug Use: Yes    Special: Marijuana  . Sexually Active: Not on file   Other Topics Concern  . Not on file   Social History Narrative   ** Merged History Encounter **     No past surgical history on file.  Family History  Problem Relation Age of Onset  . Diabetes Father   . Obesity Father   . Obesity Brother   . Alcohol abuse Brother     No Known Allergies  Current Outpatient Prescriptions on File Prior to Visit  Medication Sig Dispense Refill  . ALPRAZolam (XANAX) 0.5 MG tablet take 1 tablet by mouth three times a day if needed for sleep  30 tablet  0  . amphetamine-dextroamphetamine (ADDERALL XR) 20 MG 24 hr capsule Take 1 capsule (20 mg total) by mouth every morning.  30 capsule  0  . amphetamine-dextroamphetamine (ADDERALL) 10 MG tablet Take 1 tablet (10 mg total) by mouth daily.  30 tablet  0  . sertraline (ZOLOFT) 100 MG tablet take 1  tablet by mouth once daily  30 tablet  5  . SUMAtriptan (IMITREX) 100 MG tablet Take 1 tablet (100 mg total) by mouth every 2 (two) hours as needed for migraine.  30 tablet  0    BP 120/80  Pulse 76  Temp 98.3 F (36.8 C) (Oral)  Resp 18  Wt 140 lb (63.504 kg)       Review of Systems  Constitutional: Negative.   HENT: Negative for hearing loss, congestion, sore throat, rhinorrhea, dental problem, sinus pressure and tinnitus.   Eyes: Negative for pain, discharge and visual disturbance.  Respiratory: Negative for cough and shortness of breath.   Cardiovascular: Negative for chest pain, palpitations and leg swelling.  Gastrointestinal: Positive for abdominal pain. Negative for nausea, vomiting, diarrhea, constipation, blood in stool and abdominal distention.  Genitourinary: Positive for dysuria. Negative for urgency, frequency, hematuria, flank pain, vaginal bleeding, vaginal discharge, difficulty urinating, vaginal pain and pelvic pain.  Musculoskeletal: Positive for back pain. Negative for joint swelling, arthralgias and gait problem.  Skin: Negative for rash.  Neurological: Negative for dizziness, syncope, speech difficulty, weakness, numbness and headaches.  Hematological: Negative for adenopathy.  Psychiatric/Behavioral: Negative for behavioral problems, dysphoric mood and agitation. The patient is not nervous/anxious.  Objective:   Physical Exam  Constitutional: She is oriented to person, place, and time. She appears well-developed and well-nourished.       Afebrile no acute distress.  HENT:  Head: Normocephalic.  Right Ear: External ear normal.  Left Ear: External ear normal.  Mouth/Throat: Oropharynx is clear and moist.  Eyes: Conjunctivae normal and EOM are normal. Pupils are equal, round, and reactive to light.  Neck: Normal range of motion. Neck supple. No thyromegaly present.  Cardiovascular: Normal rate, regular rhythm, normal heart sounds and intact distal  pulses.   Pulmonary/Chest: Effort normal and breath sounds normal.  Abdominal: Soft. Bowel sounds are normal. She exhibits no mass. There is no tenderness.       Mild right CVA tenderness Mild tenderness along the right abdominal regions  Musculoskeletal: Normal range of motion.  Lymphadenopathy:    She has no cervical adenopathy.  Neurological: She is alert and oriented to person, place, and time.  Skin: Skin is warm and dry. No rash noted.  Psychiatric: She has a normal mood and affect. Her behavior is normal.          Assessment & Plan:   Possible kidney stone  Will treat with vicodin, force fluids

## 2012-12-03 ENCOUNTER — Encounter (HOSPITAL_BASED_OUTPATIENT_CLINIC_OR_DEPARTMENT_OTHER): Payer: Self-pay | Admitting: *Deleted

## 2012-12-03 ENCOUNTER — Emergency Department (HOSPITAL_BASED_OUTPATIENT_CLINIC_OR_DEPARTMENT_OTHER): Payer: 59

## 2012-12-03 ENCOUNTER — Emergency Department (HOSPITAL_BASED_OUTPATIENT_CLINIC_OR_DEPARTMENT_OTHER)
Admission: EM | Admit: 2012-12-03 | Discharge: 2012-12-04 | Disposition: A | Discharge status: Home / Self Care | Payer: 59 | Attending: Emergency Medicine | Admitting: Emergency Medicine

## 2012-12-03 DIAGNOSIS — Z8669 Personal history of other diseases of the nervous system and sense organs: Secondary | ICD-10-CM | POA: Insufficient documentation

## 2012-12-03 DIAGNOSIS — F3289 Other specified depressive episodes: Secondary | ICD-10-CM | POA: Insufficient documentation

## 2012-12-03 DIAGNOSIS — F329 Major depressive disorder, single episode, unspecified: Secondary | ICD-10-CM | POA: Insufficient documentation

## 2012-12-03 DIAGNOSIS — F17200 Nicotine dependence, unspecified, uncomplicated: Secondary | ICD-10-CM | POA: Insufficient documentation

## 2012-12-03 DIAGNOSIS — Y939 Activity, unspecified: Secondary | ICD-10-CM | POA: Insufficient documentation

## 2012-12-03 DIAGNOSIS — X58XXXA Exposure to other specified factors, initial encounter: Secondary | ICD-10-CM | POA: Insufficient documentation

## 2012-12-03 DIAGNOSIS — Y929 Unspecified place or not applicable: Secondary | ICD-10-CM | POA: Insufficient documentation

## 2012-12-03 DIAGNOSIS — T148XXA Other injury of unspecified body region, initial encounter: Secondary | ICD-10-CM

## 2012-12-03 DIAGNOSIS — Z3202 Encounter for pregnancy test, result negative: Secondary | ICD-10-CM | POA: Insufficient documentation

## 2012-12-03 DIAGNOSIS — F411 Generalized anxiety disorder: Secondary | ICD-10-CM | POA: Insufficient documentation

## 2012-12-03 DIAGNOSIS — R002 Palpitations: Secondary | ICD-10-CM | POA: Insufficient documentation

## 2012-12-03 DIAGNOSIS — Z79899 Other long term (current) drug therapy: Secondary | ICD-10-CM | POA: Insufficient documentation

## 2012-12-03 DIAGNOSIS — IMO0002 Reserved for concepts with insufficient information to code with codable children: Secondary | ICD-10-CM | POA: Insufficient documentation

## 2012-12-03 LAB — URINALYSIS, ROUTINE W REFLEX MICROSCOPIC
Glucose, UA: NEGATIVE mg/dL
Nitrite: NEGATIVE
Protein, ur: NEGATIVE mg/dL

## 2012-12-03 LAB — URINE MICROSCOPIC-ADD ON

## 2012-12-03 LAB — PREGNANCY, URINE: Preg Test, Ur: NEGATIVE

## 2012-12-03 NOTE — ED Notes (Signed)
C/o flank pain x 1 week- ?Kidney stones- seen by PCP and started on cipro and hydrocodone

## 2012-12-04 LAB — BASIC METABOLIC PANEL
BUN: 13 mg/dL (ref 6–23)
Calcium: 9.2 mg/dL (ref 8.4–10.5)
Creatinine, Ser: 0.9 mg/dL (ref 0.50–1.10)
GFR calc Af Amer: >90 mL/min (ref >90)
GFR calc non Af Amer: >90 mL/min (ref >90)
Glucose, Bld: 100 mg/dL — ABNORMAL HIGH (ref 70–99)
Potassium: 3.6 mEq/L (ref 3.5–5.1)

## 2012-12-04 LAB — CBC
HCT: 39.1 % (ref 36.0–46.0)
Hemoglobin: 13.3 g/dL (ref 12.0–15.0)
MCH: 31.5 pg (ref 26.0–34.0)
MCHC: 34 g/dL (ref 30.0–36.0)
MCV: 92.7 fL (ref 78.0–100.0)
Platelets: 278 K/uL (ref 150–400)
RBC: 4.22 MIL/uL (ref 3.87–5.11)
RDW: 12.3 % (ref 11.5–15.5)
WBC: 8.9 K/uL (ref 4.0–10.5)

## 2012-12-04 MED ORDER — METHOCARBAMOL 500 MG PO TABS
1000.0000 mg | ORAL_TABLET | Freq: Once | ORAL | Status: AC
  Administered 2012-12-04: 1000 mg via ORAL
  Filled 2012-12-04: qty 2

## 2012-12-04 MED ORDER — NAPROXEN 375 MG PO TABS: 375.0000 mg | ORAL_TABLET | Freq: Two times a day (BID) | ORAL | Status: DC

## 2012-12-04 MED ORDER — METHOCARBAMOL 500 MG PO TABS: 500.0000 mg | ORAL_TABLET | Freq: Two times a day (BID) | ORAL | Status: DC

## 2012-12-04 MED ORDER — SODIUM CHLORIDE 0.9 % IV SOLN
Freq: Once | INTRAVENOUS | Status: AC
  Administered 2012-12-04: 01:00:00 via INTRAVENOUS

## 2012-12-04 MED ORDER — KETOROLAC TROMETHAMINE 30 MG/ML IJ SOLN
30.0000 mg | Freq: Once | INTRAMUSCULAR | Status: AC
  Administered 2012-12-04: 30 mg via INTRAVENOUS
  Filled 2012-12-04: qty 1

## 2012-12-04 NOTE — ED Notes (Signed)
Patient back from CT.

## 2012-12-04 NOTE — ED Provider Notes (Signed)
History     CSN: [id number]  Arrival date & time 12/03/12  2033   First MD Initiated Contact with Patient 12/04/12 0008      Chief Complaint  Patient presents with  . Flank Pain    (Consider location/radiation/quality/duration/timing/severity/associated sxs/prior treatment) Patient is a 21 y.o. female presenting with flank pain. The history is provided by the patient. No language interpreter was used.  Flank Pain This is a new problem. The current episode started more than 1 week ago. The problem occurs constantly. The problem has not changed since onset.Pertinent negatives include no chest pain, no abdominal pain, no headaches and no shortness of breath. Nothing aggravates the symptoms. Nothing relieves the symptoms. Treatments tried: narcotics. The treatment provided no relief.    Past Medical History  Diagnosis Date  . ANXIETY 03/06/2009  . DEPRESSION 03/06/2009  . Migraine   . Depression     Past Surgical History  Procedure Date  . Wisdom tooth extraction     Family History  Problem Relation Age of Onset  . Diabetes Father   . Obesity Father   . Obesity Brother   . Alcohol abuse Brother     History  Substance Use Topics  . Smoking status: Current Some Day Smoker    Types: Cigarettes    Last Attempt to Quit: 12/10/2011  . Smokeless tobacco: Never Used  . Alcohol Use: 0.6 oz/week    1 Glasses of wine per week    OB History    Grav Para Term Preterm Abortions TAB SAB Ect Mult Living                  Review of Systems  Respiratory: Negative for shortness of breath.   Cardiovascular: Positive for palpitations. Negative for chest pain and leg swelling.  Gastrointestinal: Negative for abdominal pain.  Genitourinary: Positive for flank pain. Negative for dysuria and vaginal discharge.  Neurological: Negative for headaches.  All other systems reviewed and are negative.    Allergies  Review of patient's allergies indicates no known allergies.  Home  Medications   Current Outpatient Rx  Name  Route  Sig  Dispense  Refill  . ALPRAZOLAM 0.5 MG PO TABS      take 1 tablet by mouth three times a day if needed for sleep   30 tablet   0   . AMPHETAMINE-DEXTROAMPHET ER 20 MG PO CP24   Oral   Take 1 capsule (20 mg total) by mouth every morning.   30 capsule   0   . AMPHETAMINE-DEXTROAMPHETAMINE 10 MG PO TABS   Oral   Take 1 tablet (10 mg total) by mouth daily.   30 tablet   0   . CIPROFLOXACIN HCL 500 MG PO TABS   Oral   Take 1 tablet (500 mg total) by mouth 2 (two) times daily.   6 tablet   0   . HYDROCODONE-ACETAMINOPHEN 5-500 MG PO TABS   Oral   Take 1 tablet by mouth every 8 (eight) hours as needed for pain.   30 tablet   0   . SERTRALINE HCL 100 MG PO TABS      take 1 tablet by mouth once daily   30 tablet   5   . SUMATRIPTAN SUCCINATE 100 MG PO TABS   Oral   Take 1 tablet (100 mg total) by mouth every 2 (two) hours as needed for migraine.   30 tablet   0     Must be  seen for future refills   . PHENAZOPYRIDINE HCL 95 MG PO TABS   Oral   Take 95 mg by mouth 3 (three) times daily as needed.           BP 116/64  Pulse 87  Temp 98.6 F (37 C) (Oral)  Resp 18  Ht 5\' 5"  (1.651 m)  Wt 140 lb (63.504 kg)  BMI 23.30 kg/m2  SpO2 100%  Physical Exam  Constitutional: She is oriented to person, place, and time. She appears well-developed and well-nourished. No distress.  HENT:  Head: Normocephalic and atraumatic.  Mouth/Throat: Oropharynx is clear and moist.  Eyes: Conjunctivae normal are normal. Pupils are equal, round, and reactive to light.  Neck: Normal range of motion. Neck supple.  Cardiovascular: Normal rate, regular rhythm and intact distal pulses.   Pulmonary/Chest: Effort normal and breath sounds normal. She has no wheezes. She has no rales.  Abdominal: Soft. Bowel sounds are normal. There is no tenderness. There is no rebound and no guarding.  Musculoskeletal: Normal range of motion.    Neurological: She is alert and oriented to person, place, and time.  Skin: Skin is warm and dry.  Psychiatric: She has a normal mood and affect.    ED Course  Procedures (including critical care time)  Labs Reviewed  URINALYSIS, ROUTINE W REFLEX MICROSCOPIC - Abnormal; Notable for the following:    Hgb urine dipstick TRACE (*)     Leukocytes, UA SMALL (*)     All other components within normal limits  URINE MICROSCOPIC-ADD ON - Abnormal; Notable for the following:    Squamous Epithelial / LPF FEW (*)     All other components within normal limits  PREGNANCY, URINE  CBC  BASIC METABOLIC PANEL   Ct Abdomen Pelvis Wo Contrast  12/04/2012  *RADIOLOGY REPORT*  Clinical Data: Bilateral flank pain, left greater than right.  CT ABDOMEN AND PELVIS WITHOUT CONTRAST  Technique:  Multidetector CT imaging of the abdomen and pelvis was performed following the standard protocol without intravenous contrast.  Comparison: Pelvis radiograph performed 07/15/2012  Findings: The visualized lung bases are clear.  The liver and spleen are unremarkable in appearance.  The gallbladder is relatively decompressed and within normal limits. The pancreas and adrenal glands are unremarkable.  The kidneys are unremarkable in appearance.  There is no evidence of hydronephrosis.  No renal or ureteral stones are seen.  No perinephric stranding is appreciated.  No free fluid is identified.  The small bowel is unremarkable in appearance.  The stomach is within normal limits.  No acute vascular abnormalities are seen.  The appendix is normal in caliber, without evidence for appendicitis.  A small amount of residual contrast is noted in the stool at the mid transverse colon.  The colon is unremarkable in appearance.  The bladder is decompressed and grossly unremarkable in appearance; a small urachal remnant is incidentally noted.  The uterus is within normal limits.  The ovaries are relatively symmetric; no suspicious adnexal  masses are seen.  No inguinal lymphadenopathy is seen.  No acute osseous abnormalities are identified.  IMPRESSION: Unremarkable noncontrast CT of the abdomen and pelvis.   Original Report Authenticated By: Tonia Ghent, M.D.      No diagnosis found.    MDM  Ct scan completely normal.  Exam reassuring.  Will treat as MSK pain.  Complete abx and follow up with Dr. Amador Cunas       Annaka Cleaver Smitty Cords, MD 12/04/12 [phone]

## 2012-12-04 NOTE — ED Notes (Signed)
Patient in CT

## 2012-12-07 ENCOUNTER — Ambulatory Visit (INDEPENDENT_AMBULATORY_CARE_PROVIDER_SITE_OTHER): Payer: 59 | Admitting: Internal Medicine

## 2012-12-07 ENCOUNTER — Encounter: Payer: Self-pay | Admitting: Internal Medicine

## 2012-12-07 VITALS — BP 110/70 | HR 88 | Temp 98.3°F | Resp 16 | Wt 145.0 lb

## 2012-12-07 DIAGNOSIS — M549 Dorsalgia, unspecified: Secondary | ICD-10-CM

## 2012-12-07 MED ORDER — ALPRAZOLAM 0.5 MG PO TABS: 0.5000 mg | ORAL_TABLET | Freq: Three times a day (TID) | ORAL | Status: DC | PRN

## 2012-12-07 NOTE — Patient Instructions (Signed)
You  may move around, but avoid painful motions and activities.  Apply  Heat  to the sore area for 15 to 20 minutes 3 or 4 times daily for the next two to 3 days.Back Injury Prevention Back injuries can be extremely painful and difficult to heal. After having one back injury, you are much more likely to experience another later on. It is important to learn how to avoid injuring or re-injuring your back. The following tips can help you to prevent a back injury. PHYSICAL FITNESS  Exercise regularly and try to develop good tone in your abdominal muscles. Your abdominal muscles provide a lot of the support needed by your back.   Do aerobic exercises (walking, jogging, biking, swimming) regularly.   Do exercises that increase balance and strength (tai chi, yoga) regularly. This can decrease your risk of falling and injuring your back.   Stretch before and after exercising.   Maintain a healthy weight. The more you weigh, the more stress is placed on your back. For every pound of weight, 10 times that amount of pressure is placed on the back.  DIET  Talk to your caregiver about how much calcium and vitamin D you need per day. These nutrients help to prevent weakening of the bones (osteoporosis). Osteoporosis can cause broken (fractured) bones that lead to back pain.   Include good sources of calcium in your diet, such as dairy products, green, leafy vegetables, and products with calcium added (fortified).   Include good sources of vitamin D in your diet, such as milk and foods that are fortified with vitamin D.   Consider taking a nutritional supplement or a multivitamin if needed.   Stop smoking if you smoke.  POSTURE  Sit and stand up straight. Avoid leaning forward when you sit or hunching over when you stand.   Choose chairs with good low back (lumbar) support.   If you work at a desk, sit close to your work so you do not need to lean over. Keep your chin tucked in. Keep your neck drawn  back and elbows bent at a right angle. Your arms should look like the letter "L."   Sit high and close to the steering wheel when you drive. Add a lumbar support to your car seat if needed.   Avoid sitting or standing in one position for too long. Take breaks to get up, stretch, and walk around at least once every hour. Take breaks if you are driving for long periods of time.   Sleep on your side with your knees slightly bent, or sleep on your back with a pillow under your knees. Do not sleep on your stomach.  LIFTING, TWISTING, AND REACHING  Avoid heavy lifting, especially repetitive lifting. If you must do heavy lifting:   Stretch before lifting.   Work slowly.   Rest between lifts.   Use carts and dollies to move objects when possible.   Make several small trips instead of carrying 1 heavy load.   Ask for help when you need it.   Ask for help when moving big, awkward objects.   Follow these steps when lifting:   Stand with your feet shoulder-width apart.   Get as close to the object as you can. Do not try to pick up heavy objects that are far from your body.   Use handles or lifting straps if they are available.   Bend at your knees. Squat down, but keep your heels off the floor.  Keep your shoulders pulled back, your chin tucked in, and your back straight.   Lift the object slowly, tightening the muscles in your legs, abdomen, and buttocks. Keep the object as close to the center of your body as possible.   When you put a load down, use these same guidelines in reverse.   Do not:   Lift the object above your waist.   Twist at the waist while lifting or carrying a load. Move your feet if you need to turn, not your waist.   Bend over without bending at your knees.   Avoid reaching over your head, across a table, or for an object on a high surface.  OTHER TIPS  Avoid wet floors and keep sidewalks clear of ice to prevent falls.   Do not sleep on a mattress that  is too soft or too hard.   Keep items that are used frequently within easy reach.   Put heavier objects on shelves at waist level and lighter objects on lower or higher shelves.   Find ways to decrease your stress, such as exercise, massage, or relaxation techniques. Stress can build up in your muscles. Tense muscles are more vulnerable to injury.   Seek treatment for depression or anxiety if needed. These conditions can increase your risk of developing back pain.  SEEK MEDICAL CARE IF:  You injure your back.   You have questions about diet, exercise, or other ways to prevent back injuries.  MAKE SURE YOU:  Understand these instructions.   Will watch your condition.   Will get help right away if you are not doing well or get worse.  Document Released: 01/02/2005 Document Revised: 02/17/2012 Document Reviewed: 01/06/2012 [facility] Patient Information 2013 [place], [place].   Back Exercises Back exercises help treat and prevent back injuries. The goal of back exercises is to increase the strength of your abdominal and back muscles and the flexibility of your back. These exercises should be started when you no longer have back pain. Back exercises include:  Pelvic Tilt. Lie on your back with your knees bent. Tilt your pelvis until the lower part of your back is against the floor. Hold this position 5 to 10 sec and repeat 5 to 10 times.   Knee to Chest. Pull first 1 knee up against your chest and hold for 20 to 30 seconds, repeat this with the other knee, and then both knees. This may be done with the other leg straight or bent, whichever feels better.   Sit-Ups or Curl-Ups. Bend your knees 90 degrees. Start with tilting your pelvis, and do a partial, slow sit-up, lifting your trunk only 30 to 45 degrees off the floor. Take at least 2 to 3 seconds for each sit-up. Do not do sit-ups with your knees out straight. If partial sit-ups are difficult, simply do the above but with only tightening  your abdominal muscles and holding it as directed.   Hip-Lift. Lie on your back with your knees flexed 90 degrees. Push down with your feet and shoulders as you raise your hips a couple inches off the floor; hold for 10 seconds, repeat 5 to 10 times.   Back arches. Lie on your stomach, propping yourself up on bent elbows. Slowly press on your hands, causing an arch in your low back. Repeat 3 to 5 times. Any initial stiffness and discomfort should lessen with repetition over time.   Shoulder-Lifts. Lie face down with arms beside your body. Keep hips and torso pressed  to floor as you slowly lift your head and shoulders off the floor.  Do not overdo your exercises, especially in the beginning. Exercises may cause you some mild back discomfort which lasts for a few minutes; however, if the pain is more severe, or lasts for more than 15 minutes, do not continue exercises until you see your caregiver. Improvement with exercise therapy for back problems is slow.   See your caregivers for assistance with developing a proper back exercise program. Document Released: 01/02/2005 Document Revised: 02/17/2012 Document Reviewed: 09/26/2011 [facility] Patient Information 2013 [place], [place].

## 2012-12-07 NOTE — Progress Notes (Signed)
Subjective:    Patient ID: Leah Perez, female    DOB: 31-Jul-1991, 21 y.o.   MRN: [id number]  HPI  21 year old patient who is seen today following an ED visit.  If she continues to have some low back pain but this has greatly improved there was some concern about renal colic N/A abdominal CT scan was performed that was normal. Her work does require considerable lifting bending and stooping. No radicular symptoms.  ED records reviewed  Past Medical History  Diagnosis Date  . ANXIETY 03/06/2009  . DEPRESSION 03/06/2009  . Migraine   . Depression     History   Social History  . Marital Status: Single    Spouse Name: N/A    Number of Children: N/A  . Years of Education: N/A   Occupational History  . Not on file.   Social History Main Topics  . Smoking status: Current Some Day Smoker    Types: Cigarettes    Last Attempt to Quit: 12/10/2011  . Smokeless tobacco: Never Used  . Alcohol Use: 0.6 oz/week    1 Glasses of wine per week  . Drug Use: Yes    Special: Marijuana  . Sexually Active: Yes    Birth Control/ Protection: Implant   Other Topics Concern  . Not on file   Social History Narrative   ** Merged History Encounter **     Past Surgical History  Procedure Date  . Wisdom tooth extraction     Family History  Problem Relation Age of Onset  . Diabetes Father   . Obesity Father   . Obesity Brother   . Alcohol abuse Brother     No Known Allergies  Current Outpatient Prescriptions on File Prior to Visit  Medication Sig Dispense Refill  . amphetamine-dextroamphetamine (ADDERALL XR) 20 MG 24 hr capsule Take 1 capsule (20 mg total) by mouth every morning.  30 capsule  0  . amphetamine-dextroamphetamine (ADDERALL) 10 MG tablet Take 1 tablet (10 mg total) by mouth daily.  30 tablet  0  . ciprofloxacin (CIPRO) 500 MG tablet Take 1 tablet (500 mg total) by mouth 2 (two) times daily.  6 tablet  0  . HYDROcodone-acetaminophen (VICODIN) 5-500 MG per tablet Take 1  tablet by mouth every 8 (eight) hours as needed for pain.  30 tablet  0  . methocarbamol (ROBAXIN) 500 MG tablet Take 1 tablet (500 mg total) by mouth 2 (two) times daily.  20 tablet  0  . naproxen (NAPROSYN) 375 MG tablet Take 1 tablet (375 mg total) by mouth 2 (two) times daily.  20 tablet  0  . sertraline (ZOLOFT) 100 MG tablet take 1 tablet by mouth once daily  30 tablet  5  . SUMAtriptan (IMITREX) 100 MG tablet Take 1 tablet (100 mg total) by mouth every 2 (two) hours as needed for migraine.  30 tablet  0  . phenazopyridine (PYRIDIUM) 95 MG tablet Take 95 mg by mouth 3 (three) times daily as needed.        BP 110/70  Pulse 88  Temp 98.3 F (36.8 C) (Oral)  Resp 16  Wt 145 lb (65.772 kg)  SpO2 97%       Review of Systems  Constitutional: Negative.   HENT: Negative for hearing loss, congestion, sore throat, rhinorrhea, dental problem, sinus pressure and tinnitus.   Eyes: Negative for pain, discharge and visual disturbance.  Respiratory: Negative for cough and shortness of breath.   Cardiovascular: Negative for chest  pain, palpitations and leg swelling.  Gastrointestinal: Negative for nausea, vomiting, abdominal pain, diarrhea, constipation, blood in stool and abdominal distention.  Genitourinary: Negative for dysuria, urgency, frequency, hematuria, flank pain, vaginal bleeding, vaginal discharge, difficulty urinating, vaginal pain and pelvic pain.  Musculoskeletal: Positive for back pain. Negative for joint swelling, arthralgias and gait problem.  Skin: Negative for rash.  Neurological: Negative for dizziness, syncope, speech difficulty, weakness, numbness and headaches.  Hematological: Negative for adenopathy.  Psychiatric/Behavioral: Negative for behavioral problems, dysphoric mood and agitation. The patient is not nervous/anxious.        Objective:   Physical Exam  Constitutional: She appears well-nourished. No distress.  Musculoskeletal:       Negative straight leg  test Slight tenderness in the lumbar area bilaterally          Assessment & Plan:   Low back pain. Suspect musculoligamentous strain. Information dispensed We'll continue Aleve twice daily  Return if unimproved

## 2013-03-03 ENCOUNTER — Ambulatory Visit (INDEPENDENT_AMBULATORY_CARE_PROVIDER_SITE_OTHER): Payer: 59 | Admitting: Internal Medicine

## 2013-03-03 ENCOUNTER — Encounter: Payer: Self-pay | Admitting: Internal Medicine

## 2013-03-03 VITALS — BP 130/90 | HR 88 | Temp 98.1°F | Resp 18 | Wt 146.0 lb

## 2013-03-03 DIAGNOSIS — M549 Dorsalgia, unspecified: Secondary | ICD-10-CM

## 2013-03-03 MED ORDER — AMPHETAMINE-DEXTROAMPHETAMINE 10 MG PO TABS: 10.0000 mg | ORAL_TABLET | Freq: Every day | ORAL | Status: DC

## 2013-03-03 MED ORDER — ALPRAZOLAM 0.5 MG PO TABS: 0.5000 mg | ORAL_TABLET | Freq: Three times a day (TID) | ORAL | Status: DC | PRN

## 2013-03-03 MED ORDER — METHYLPREDNISOLONE ACETATE 80 MG/ML IJ SUSP
80.0000 mg | Freq: Once | INTRAMUSCULAR | Status: AC
  Administered 2013-03-03: 80 mg via INTRAMUSCULAR

## 2013-03-03 MED ORDER — AMPHETAMINE-DEXTROAMPHET ER 20 MG PO CP24: 20.0000 mg | ORAL_CAPSULE | ORAL | Status: DC

## 2013-03-03 NOTE — Patient Instructions (Signed)
You  may move around, but avoid painful motions and activities.  Apply ice to the sore area for 15 to 20 minutes 3 or 4 times daily for the next two to 3 days.   Take Aleve 200 mg twice daily for pain or swelling Back Exercises Back exercises help treat and prevent back injuries. The goal of back exercises is to increase the strength of your abdominal and back muscles and the flexibility of your back. These exercises should be started when you no longer have back pain. Back exercises include:  Pelvic Tilt. Lie on your back with your knees bent. Tilt your pelvis until the lower part of your back is against the floor. Hold this position 5 to 10 sec and repeat 5 to 10 times.  Knee to Chest. Pull first 1 knee up against your chest and hold for 20 to 30 seconds, repeat this with the other knee, and then both knees. This may be done with the other leg straight or bent, whichever feels better.  Sit-Ups or Curl-Ups. Bend your knees 90 degrees. Start with tilting your pelvis, and do a partial, slow sit-up, lifting your trunk only 30 to 45 degrees off the floor. Take at least 2 to 3 seconds for each sit-up. Do not do sit-ups with your knees out straight. If partial sit-ups are difficult, simply do the above but with only tightening your abdominal muscles and holding it as directed.  Hip-Lift. Lie on your back with your knees flexed 90 degrees. Push down with your feet and shoulders as you raise your hips a couple inches off the floor; hold for 10 seconds, repeat 5 to 10 times.  Back arches. Lie on your stomach, propping yourself up on bent elbows. Slowly press on your hands, causing an arch in your low back. Repeat 3 to 5 times. Any initial stiffness and discomfort should lessen with repetition over time.  Shoulder-Lifts. Lie face down with arms beside your body. Keep hips and torso pressed to floor as you slowly lift your head and shoulders off the floor. Do not overdo your exercises, especially in the  beginning. Exercises may cause you some mild back discomfort which lasts for a few minutes; however, if the pain is more severe, or lasts for more than 15 minutes, do not continue exercises until you see your caregiver. Improvement with exercise therapy for back problems is slow.  See your caregivers for assistance with developing a proper back exercise program. Document Released: 01/02/2005 Document Revised: 02/17/2012 Document Reviewed: 09/26/2011 [facility] Patient Information 2013 [place], [place].

## 2013-03-03 NOTE — Progress Notes (Signed)
Subjective:    Patient ID: Leah Perez, female    DOB: 1991-01-13, 22 y.o.   MRN: [id number]  HPI  22 year old patient who is seen today for followup. For the past couple days she has had recurrent back pain most marked involving the right posterior lateral chest wall. This also radiates somewhat involving the right flank region. She has had similar pain in the past. 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  Past Medical History  Diagnosis Date  . ANXIETY 03/06/2009  . DEPRESSION 03/06/2009  . Migraine   . Depression     History   Social History  . Marital Status: Single    Spouse Name: N/A    Number of Children: N/A  . Years of Education: N/A   Occupational History  . Not on file.   Social History Main Topics  . Smoking status: Current Some Day Smoker    Types: Cigarettes    Last Attempt to Quit: 12/10/2011  . Smokeless tobacco: Never Used  . Alcohol Use: 0.6 oz/week    1 Glasses of wine per week  . Drug Use: Yes    Special: Marijuana  . Sexually Active: Yes    Birth Control/ Protection: Implant   Other Topics Concern  . Not on file   Social History Narrative   ** Merged History Encounter **        Past Surgical History  Procedure Laterality Date  . Wisdom tooth extraction      Family History  Problem Relation Age of Onset  . Diabetes Father   . Obesity Father   . Obesity Brother   . Alcohol abuse Brother     No Known Allergies  Current Outpatient Prescriptions on File Prior to Visit  Medication Sig Dispense Refill  . sertraline (ZOLOFT) 100 MG tablet take 1 tablet by mouth once daily  30 tablet  5  . SUMAtriptan (IMITREX) 100 MG tablet Take 1 tablet (100 mg total) by mouth every 2 (two) hours as needed for migraine.  30 tablet  0   No current facility-administered medications on file prior to visit.    BP 130/90  Pulse 88  Temp(Src) 98.1 F (36.7 C) (Oral)  Resp 18  Wt 146 lb  (66.225 kg)  BMI 24.3 kg/m2  SpO2 98%       Review of Systems  Constitutional: Negative.   HENT: Negative for hearing loss, congestion, sore throat, rhinorrhea, dental problem, sinus pressure and tinnitus.   Eyes: Negative for pain, discharge and visual disturbance.  Respiratory: Negative for cough and shortness of breath.   Cardiovascular: Negative for chest pain, palpitations and leg swelling.  Gastrointestinal: Negative for nausea, vomiting, abdominal pain, diarrhea, constipation, blood in stool and abdominal distention.  Genitourinary: Negative for dysuria, urgency, frequency, hematuria, flank pain, vaginal bleeding, vaginal discharge, difficulty urinating, vaginal pain and pelvic pain.  Musculoskeletal: Positive for back pain. Negative for joint swelling, arthralgias and gait problem.  Skin: Negative for rash.  Neurological: Negative for dizziness, syncope, speech difficulty, weakness, numbness and headaches.  Hematological: Negative for adenopathy.  Psychiatric/Behavioral: Negative for behavioral problems, dysphoric mood and agitation. The patient is not nervous/anxious.        Objective:   Physical Exam  Constitutional: She appears well-developed and well-nourished. No distress.  Musculoskeletal: She exhibits tenderness.  Slight tenderness most maximal in the right interscapular area          Assessment & Plan:  Back pain. Most consistent with a muscular strain. Exercises and stretching activities discussed and encouraged. Warm compresses and rest encouraged. She will use anti-inflammatory medication such as Aleve when necessary

## 2013-06-30 ENCOUNTER — Telehealth: Payer: Self-pay | Admitting: Internal Medicine

## 2013-06-30 NOTE — Telephone Encounter (Signed)
Patient Information:  Caller Name: Vonnetta  Phone: [phone]  Patient: Leah Perez, Leah Perez  Gender: Female  DOB: October 22, 1991  Age: 22 Years  PCP: Eleonore Chiquito [facility])  Pregnant: No  Office Follow Up:  Does the office need to follow up with this patient?: No  Instructions For The Office: N/A  RN Note:  Crying. Feels loss of control since suddenly thought about father's message that he is not coming home.  Out of Xanax for at least 1 week.  Reports chest tightness and pressure present now with shortness of breath, diaphoresis and lightheaded.   Advised to call 911 now for any cardiac signs or symptoms present now for > 5 minutes per Panic Attack guideline.  Symptoms  Reason For Call & Symptoms: Emergent call: Calling about panic attack/severe anxiety, crying. Asking for refill of Xanax.  Onset: 06/30/13 at 1455.  Ran out of Xanax 1 week ago.  Uses Xanax prn 2-3 X/week or if panic attack.  Reports just received word her father is not coming home which set off symptoms.  Last office visit 03/03/13.  Reviewed Health History In EMR: Yes  Reviewed Medications In EMR: Yes  Reviewed Allergies In EMR: Yes  Reviewed Surgeries / Procedures: Yes  Date of Onset of Symptoms: 06/30/2013 OB / GYN:  LMP: 06/28/2013  Guideline(s) Used:  No Protocol Available - Sick Adult  Disposition Per Guideline:   Call EMS 911 Now  Reason For Disposition Reached:   Sounds like a life-threatening emergency to the triager  Advice Given:  N/A  Patient Will Follow Care Advice:  YES

## 2013-07-01 NOTE — Telephone Encounter (Signed)
Left message on voicemail to call office, follow up on yesterday's call.

## 2013-07-27 ENCOUNTER — Encounter: Payer: Self-pay | Admitting: Internal Medicine

## 2013-07-27 ENCOUNTER — Ambulatory Visit (INDEPENDENT_AMBULATORY_CARE_PROVIDER_SITE_OTHER): Payer: 59 | Admitting: Internal Medicine

## 2013-07-27 VITALS — BP 130/80 | HR 99 | Temp 98.2°F | Resp 20 | Wt 144.0 lb

## 2013-07-27 DIAGNOSIS — F909 Attention-deficit hyperactivity disorder, unspecified type: Secondary | ICD-10-CM

## 2013-07-27 DIAGNOSIS — F411 Generalized anxiety disorder: Secondary | ICD-10-CM

## 2013-07-27 MED ORDER — AMPHETAMINE-DEXTROAMPHETAMINE 10 MG PO TABS: 10.0000 mg | ORAL_TABLET | Freq: Every day | ORAL | Status: DC

## 2013-07-27 MED ORDER — AMPHETAMINE-DEXTROAMPHET ER 20 MG PO CP24: 20.0000 mg | ORAL_CAPSULE | ORAL | Status: DC

## 2013-07-27 MED ORDER — ALPRAZOLAM 0.5 MG PO TABS: 0.5000 mg | ORAL_TABLET | Freq: Three times a day (TID) | ORAL | Status: DC | PRN

## 2013-07-27 MED ORDER — SERTRALINE HCL 100 MG PO TABS: ORAL_TABLET | ORAL | Status: DC

## 2013-07-27 MED ORDER — SUMATRIPTAN SUCCINATE 100 MG PO TABS: 100.0000 mg | ORAL_TABLET | ORAL | Status: DC | PRN

## 2013-07-27 NOTE — Progress Notes (Signed)
Subjective:    Patient ID: Leah Perez, female    DOB: [birth year], 22 y.o.   MRN: [id number]  HPI  22 year old female who is seen today for followup. She has a history of ADHD. She has been under considerable situational stress 2 to family issues. Her father has separated from her mother and has left the country taking all aspects. Her boyfriend's parents have also recently split. She is also having considerable job-related stressors and financial concerns  Past Medical History  Diagnosis Date  . ANXIETY 03/06/2009  . DEPRESSION 03/06/2009  . Migraine   . Depression     History   Social History  . Marital Status: Single    Spouse Name: N/A    Number of Children: N/A  . Years of Education: N/A   Occupational History  . Not on file.   Social History Main Topics  . Smoking status: Current Some Day Smoker    Types: Cigarettes    Last Attempt to Quit: 12/10/2011  . Smokeless tobacco: Never Used  . Alcohol Use: 0.6 oz/week    1 Glasses of wine per week  . Drug Use: Yes    Special: Marijuana  . Sexual Activity: Yes    Birth Control/ Protection: Implant   Other Topics Concern  . Not on file   Social History Narrative   ** Merged History Encounter **        Past Surgical History  Procedure Laterality Date  . Wisdom tooth extraction      Family History  Problem Relation Age of Onset  . Diabetes Father   . Obesity Father   . Obesity Brother   . Alcohol abuse Brother     No Known Allergies  No current outpatient prescriptions on file prior to visit.   No current facility-administered medications on file prior to visit.    BP 130/80  Pulse 99  Temp(Src) 98.2 F (36.8 C) (Oral)  Resp 20  Wt 144 lb (65.318 kg)  BMI 23.96 kg/m2  SpO2 98%     Review of Systems  Constitutional: Negative.   HENT: Negative for hearing loss, congestion, sore throat, rhinorrhea, dental problem, sinus pressure and tinnitus.   Eyes: Negative for pain, discharge and visual  disturbance.  Respiratory: Negative for cough and shortness of breath.   Cardiovascular: Negative for chest pain, palpitations and leg swelling.  Gastrointestinal: Negative for nausea, vomiting, abdominal pain, diarrhea, constipation, blood in stool and abdominal distention.  Genitourinary: Negative for dysuria, urgency, frequency, hematuria, flank pain, vaginal bleeding, vaginal discharge, difficulty urinating, vaginal pain and pelvic pain.  Musculoskeletal: Negative for joint swelling, arthralgias and gait problem.  Skin: Negative for rash.  Neurological: Negative for dizziness, syncope, speech difficulty, weakness, numbness and headaches.  Hematological: Negative for adenopathy.  Psychiatric/Behavioral: Positive for decreased concentration. Negative for behavioral problems, dysphoric mood and agitation. The patient is nervous/anxious.        Objective:   Physical Exam  Constitutional: She is oriented to person, place, and time. She appears well-developed and well-nourished.  Anxious  HENT:  Head: Normocephalic.  Right Ear: External ear normal.  Left Ear: External ear normal.  Mouth/Throat: Oropharynx is clear and moist.  Eyes: Conjunctivae and EOM are normal. Pupils are equal, round, and reactive to light.  Neck: Normal range of motion. Neck supple. No thyromegaly present.  Cardiovascular: Normal rate, regular rhythm, normal heart sounds and intact distal pulses.   Pulmonary/Chest: Effort normal and breath sounds normal.  Abdominal: Soft. Bowel sounds are  normal. She exhibits no mass. There is no tenderness.  Musculoskeletal: Normal range of motion.  Lymphadenopathy:    She has no cervical adenopathy.  Neurological: She is alert and oriented to person, place, and time.  Skin: Skin is warm and dry. No rash noted.  Psychiatric: She has a normal mood and affect. Her behavior is normal. Judgment and thought content normal.          Assessment & Plan:   Acute situational  stress ADHD  All medications refilled The patient was given information concerning behavioral health referral. She states that she will like to schedule an appointment with Judithe Modest

## 2013-07-27 NOTE — Patient Instructions (Signed)
Behavioral health referral as discussed  Call or return to clinic prn if these symptoms worsen or fail to improve as anticipated.

## 2013-12-21 ENCOUNTER — Ambulatory Visit: Payer: 59 | Admitting: Internal Medicine

## 2013-12-21 ENCOUNTER — Encounter: Payer: Self-pay | Admitting: Internal Medicine

## 2013-12-21 ENCOUNTER — Ambulatory Visit (INDEPENDENT_AMBULATORY_CARE_PROVIDER_SITE_OTHER): Payer: 59 | Admitting: Internal Medicine

## 2013-12-21 VITALS — BP 110/70 | HR 79 | Temp 98.7°F | Resp 18 | Ht 65.0 in | Wt 150.0 lb

## 2013-12-21 DIAGNOSIS — Z8669 Personal history of other diseases of the nervous system and sense organs: Secondary | ICD-10-CM

## 2013-12-21 DIAGNOSIS — F909 Attention-deficit hyperactivity disorder, unspecified type: Secondary | ICD-10-CM

## 2013-12-21 MED ORDER — AMPHETAMINE-DEXTROAMPHETAMINE 10 MG PO TABS: 10.0000 mg | ORAL_TABLET | Freq: Every day | ORAL | Status: DC

## 2013-12-21 MED ORDER — ALPRAZOLAM 0.5 MG PO TABS: 0.5000 mg | ORAL_TABLET | Freq: Three times a day (TID) | ORAL | Status: DC | PRN

## 2013-12-21 MED ORDER — AMPHETAMINE-DEXTROAMPHET ER 20 MG PO CP24: 20.0000 mg | ORAL_CAPSULE | ORAL | Status: DC

## 2013-12-21 MED ORDER — SERTRALINE HCL 100 MG PO TABS: ORAL_TABLET | ORAL | Status: DC

## 2013-12-21 NOTE — Progress Notes (Signed)
Pre-visit discussion using our clinic review tool. No additional management support is needed unless otherwise documented below in the visit note.  

## 2013-12-21 NOTE — Patient Instructions (Signed)
Acute bronchitis symptoms for less than 10 days are generally not helped by antibiotics.  Take over-the-counter expectorants and cough medications such as  Mucinex DM.  Call if there is no improvement in 5 to 7 days or if he developed worsening cough, fever, or new symptoms, such as shortness of breath or chest pain.    

## 2013-12-21 NOTE — Progress Notes (Signed)
Subjective:    Patient ID: Leah Perez, female    DOB: Nov 19, 1991, 23 y.o.   MRN: [id number]  HPI  23 year old patient who has a history of ADHD. This has been well-controlled. She does have a history of anxiety depression also well controlled. For the past few days she has had URI symptoms that has improved nicely over the past 24 hours. She plans on returning to work tomorrow. No fever or chills. She has migraine headaches but now thankfully are very infrequent.  Past Medical History  Diagnosis Date  . ANXIETY 03/06/2009  . DEPRESSION 03/06/2009  . Migraine   . Depression     History   Social History  . Marital Status: Single    Spouse Name: N/A    Number of Children: N/A  . Years of Education: N/A   Occupational History  . Not on file.   Social History Main Topics  . Smoking status: Current Some Day Smoker    Types: Cigarettes    Last Attempt to Quit: 12/10/2011  . Smokeless tobacco: Never Used  . Alcohol Use: 0.6 oz/week    1 Glasses of wine per week  . Drug Use: Yes    Special: Marijuana  . Sexual Activity: Yes    Birth Control/ Protection: Implant   Other Topics Concern  . Not on file   Social History Narrative   ** Merged History Encounter **        Past Surgical History  Procedure Laterality Date  . Wisdom tooth extraction      Family History  Problem Relation Age of Onset  . Diabetes Father   . Obesity Father   . Obesity Brother   . Alcohol abuse Brother     No Known Allergies  Current Outpatient Prescriptions on File Prior to Visit  Medication Sig Dispense Refill  . SUMAtriptan (IMITREX) 100 MG tablet Take 1 tablet (100 mg total) by mouth every 2 (two) hours as needed for migraine.  30 tablet  2   No current facility-administered medications on file prior to visit.    BP 110/70  Pulse 79  Temp(Src) 98.7 F (37.1 C) (Oral)  Resp 18  Ht 5\' 5"  (1.651 m)  Wt 150 lb (68.04 kg)  BMI 24.96 kg/m2  SpO2 98%       Review of  Systems  Constitutional: Positive for fatigue.  HENT: Positive for rhinorrhea. Negative for congestion, dental problem, hearing loss, sinus pressure, sore throat and tinnitus.   Eyes: Negative for pain, discharge and visual disturbance.  Respiratory: Positive for cough. Negative for shortness of breath.   Cardiovascular: Negative for chest pain, palpitations and leg swelling.  Gastrointestinal: Negative for nausea, vomiting, abdominal pain, diarrhea, constipation, blood in stool and abdominal distention.  Genitourinary: Negative for dysuria, urgency, frequency, hematuria, flank pain, vaginal bleeding, vaginal discharge, difficulty urinating, vaginal pain and pelvic pain.  Musculoskeletal: Negative for arthralgias, gait problem and joint swelling.  Skin: Negative for rash.  Neurological: Positive for weakness. Negative for dizziness, syncope, speech difficulty, numbness and headaches.  Hematological: Negative for adenopathy.  Psychiatric/Behavioral: Negative for behavioral problems, dysphoric mood and agitation. The patient is not nervous/anxious.        Objective:   Physical Exam  Constitutional: She is oriented to person, place, and time. She appears well-developed and well-nourished.  HENT:  Head: Normocephalic.  Right Ear: External ear normal.  Left Ear: External ear normal.  Mouth/Throat: Oropharynx is clear and moist.  Eyes: Conjunctivae and EOM are  normal. Pupils are equal, round, and reactive to light.  Neck: Normal range of motion. Neck supple. No thyromegaly present.  Cardiovascular: Normal rate, regular rhythm, normal heart sounds and intact distal pulses.   Pulmonary/Chest: Effort normal and breath sounds normal.  Abdominal: Soft. Bowel sounds are normal. She exhibits no mass. There is no tenderness.  Musculoskeletal: Normal range of motion.  Lymphadenopathy:    She has no cervical adenopathy.  Neurological: She is alert and oriented to person, place, and time.  Skin: Skin  is warm and dry. No rash noted.  Psychiatric: She has a normal mood and affect. Her behavior is normal.          Assessment & Plan:   Viral URI with cough. We'll continue symptomatic treatment ADHD stable. Medications refilled depression stable. We'll recheck in 6 months

## 2013-12-23 ENCOUNTER — Ambulatory Visit: Payer: 59 | Admitting: Internal Medicine

## 2014-03-12 ENCOUNTER — Other Ambulatory Visit: Payer: Self-pay | Admitting: Internal Medicine

## 2014-03-17 ENCOUNTER — Telehealth: Payer: Self-pay | Admitting: Internal Medicine

## 2014-03-17 MED ORDER — AMPHETAMINE-DEXTROAMPHET ER 20 MG PO CP24: 20.0000 mg | ORAL_CAPSULE | ORAL | Status: DC

## 2014-03-17 NOTE — Telephone Encounter (Signed)
Spoke to pt told her Adderall Rx's will be ready today and Xanax was called into pharmacy on 4/6. Pt verbalized understanding. Rx printed and signed.

## 2014-03-17 NOTE — Telephone Encounter (Signed)
Pt req rx amphetamine-dextroamphetamine (ADDERALL XR) 20 MG 24 hr capsule amphetamine-dextroamphetamine (ADDERALL) 10 MG tablet   also Prudy FeelerXANAX

## 2014-03-25 ENCOUNTER — Ambulatory Visit (INDEPENDENT_AMBULATORY_CARE_PROVIDER_SITE_OTHER): Payer: 59 | Admitting: Internal Medicine

## 2014-03-25 ENCOUNTER — Encounter: Payer: Self-pay | Admitting: Internal Medicine

## 2014-03-25 VITALS — BP 120/80 | HR 70 | Temp 98.2°F | Resp 20 | Ht 65.0 in | Wt 154.0 lb

## 2014-03-25 DIAGNOSIS — F909 Attention-deficit hyperactivity disorder, unspecified type: Secondary | ICD-10-CM

## 2014-03-25 DIAGNOSIS — F411 Generalized anxiety disorder: Secondary | ICD-10-CM

## 2014-03-25 DIAGNOSIS — M549 Dorsalgia, unspecified: Secondary | ICD-10-CM

## 2014-03-25 MED ORDER — METHYLPREDNISOLONE ACETATE 80 MG/ML IJ SUSP
80.0000 mg | Freq: Once | INTRAMUSCULAR | Status: AC
  Administered 2014-03-25: 80 mg via INTRAMUSCULAR

## 2014-03-25 MED ORDER — AMPHETAMINE-DEXTROAMPHETAMINE 10 MG PO TABS: 10.0000 mg | ORAL_TABLET | Freq: Every day | ORAL | Status: DC

## 2014-03-25 NOTE — Patient Instructions (Signed)
Back Exercises Back exercises help treat and prevent back injuries. The goal of back exercises is to increase the strength of your abdominal and back muscles and the flexibility of your back. These exercises should be started when you no longer have back pain. Back exercises include:  Pelvic Tilt. Lie on your back with your knees bent. Tilt your pelvis until the lower part of your back is against the floor. Hold this position 5 to 10 sec and repeat 5 to 10 times.  Knee to Chest. Pull first 1 knee up against your chest and hold for 20 to 30 seconds, repeat this with the other knee, and then both knees. This may be done with the other leg straight or bent, whichever feels better.  Sit-Ups or Curl-Ups. Bend your knees 90 degrees. Start with tilting your pelvis, and do a partial, slow sit-up, lifting your trunk only 30 to 45 degrees off the floor. Take at least 2 to 3 seconds for each sit-up. Do not do sit-ups with your knees out straight. If partial sit-ups are difficult, simply do the above but with only tightening your abdominal muscles and holding it as directed.  Hip-Lift. Lie on your back with your knees flexed 90 degrees. Push down with your feet and shoulders as you raise your hips a couple inches off the floor; hold for 10 seconds, repeat 5 to 10 times.  Back arches. Lie on your stomach, propping yourself up on bent elbows. Slowly press on your hands, causing an arch in your low back. Repeat 3 to 5 times. Any initial stiffness and discomfort should lessen with repetition over time.  Shoulder-Lifts. Lie face down with arms beside your body. Keep hips and torso pressed to floor as you slowly lift your head and shoulders off the floor. Do not overdo your exercises, especially in the beginning. Exercises may cause you some mild back discomfort which lasts for a few minutes; however, if the pain is more severe, or lasts for more than 15 minutes, do not continue exercises until you see your caregiver.  Improvement with exercise therapy for back problems is slow.  See your caregivers for assistance with developing a proper back exercise program. Document Released: 01/02/2005 Document Revised: 02/17/2012 Document Reviewed: 09/26/2011 [facility] Patient Information 2014 [place]. Back Exercises These exercises may help you when beginning to rehabilitate your injury. Your symptoms may resolve with or without further involvement from your physician, physical therapist or athletic trainer. While completing these exercises, remember:   Restoring tissue flexibility helps normal motion to return to the joints. This allows healthier, less painful movement and activity.  An effective stretch should be held for at least 30 seconds.  A stretch should never be painful. You should only feel a gentle lengthening or release in the stretched tissue. STRETCH  Extension, Prone on Elbows   Lie on your stomach on the floor, a bed will be too soft. Place your palms about shoulder width apart and at the height of your head.  Place your elbows under your shoulders. If this is too painful, stack pillows under your chest.  Allow your body to relax so that your hips drop lower and make contact more completely with the floor.  Hold this position for __________ seconds.  Slowly return to lying flat on the floor. Repeat __________ times. Complete this exercise __________ times per day.  RANGE OF MOTION  Extension, Prone Press Ups   Lie on your stomach on the floor, a bed will be too  soft. Place your palms about shoulder width apart and at the height of your head.  Keeping your back as relaxed as possible, slowly straighten your elbows while keeping your hips on the floor. You may adjust the placement of your hands to maximize your comfort. As you gain motion, your hands will come more underneath your shoulders.  Hold this position __________ seconds.  Slowly return to lying flat on the floor. Repeat  __________ times. Complete this exercise __________ times per day.  RANGE OF MOTION- Quadruped, Neutral Spine   Assume a hands and knees position on a firm surface. Keep your hands under your shoulders and your knees under your hips. You may place padding under your knees for comfort.  Drop your head and point your tail bone toward the ground below you. This will round out your low back like an angry cat. Hold this position for __________ seconds.  Slowly lift your head and release your tail bone so that your back sags into a large arch, like an old horse.  Hold this position for __________ seconds.  Repeat this until you feel limber in your low back.  Now, find your "sweet spot." This will be the most comfortable position somewhere between the two previous positions. This is your neutral spine. Once you have found this position, tense your stomach muscles to support your low back.  Hold this position for __________ seconds. Repeat __________ times. Complete this exercise __________ times per day.  STRETCH  Flexion, Single Knee to Chest   Lie on a firm bed or floor with both legs extended in front of you.  Keeping one leg in contact with the floor, bring your opposite knee to your chest. Hold your leg in place by either grabbing behind your thigh or at your knee.  Pull until you feel a gentle stretch in your low back. Hold __________ seconds.  Slowly release your grasp and repeat the exercise with the opposite side. Repeat __________ times. Complete this exercise __________ times per day.  STRETCH - Hamstrings, Standing  Stand or sit and extend your right / left leg, placing your foot on a chair or foot stool  Keeping a slight arch in your low back and your hips straight forward.  Lead with your chest and lean forward at the waist until you feel a gentle stretch in the back of your right / left knee or thigh. (When done correctly, this exercise requires leaning only a small  distance.)  Hold this position for __________ seconds. Repeat __________ times. Complete this stretch __________ times per day. STRENGTHENING  Deep Abdominals, Pelvic Tilt   Lie on a firm bed or floor. Keeping your legs in front of you, bend your knees so they are both pointed toward the ceiling and your feet are flat on the floor.  Tense your lower abdominal muscles to press your low back into the floor. This motion will rotate your pelvis so that your tail bone is scooping upwards rather than pointing at your feet or into the floor.  With a gentle tension and even breathing, hold this position for __________ seconds. Repeat __________ times. Complete this exercise __________ times per day.  STRENGTHENING  Abdominals, Crunches   Lie on a firm bed or floor. Keeping your legs in front of you, bend your knees so they are both pointed toward the ceiling and your feet are flat on the floor. Cross your arms over your chest.  Slightly tip your chin down without bending your  neck.  Tense your abdominals and slowly lift your trunk high enough to just clear your shoulder blades. Lifting higher can put excessive stress on the low back and does not further strengthen your abdominal muscles.  Control your return to the starting position. Repeat __________ times. Complete this exercise __________ times per day.  STRENGTHENING  Quadruped, Opposite UE/LE Lift   Assume a hands and knees position on a firm surface. Keep your hands under your shoulders and your knees under your hips. You may place padding under your knees for comfort.  Find your neutral spine and gently tense your abdominal muscles so that you can maintain this position. Your shoulders and hips should form a rectangle that is parallel with the floor and is not twisted.  Keeping your trunk steady, lift your right hand no higher than your shoulder and then your left leg no higher than your hip. Make sure you are not holding your breath. Hold  this position __________ seconds.  Continuing to keep your abdominal muscles tense and your back steady, slowly return to your starting position. Repeat with the opposite arm and leg. Repeat __________ times. Complete this exercise __________ times per day. Document Released: 12/13/2005 Document Revised: 02/17/2012 Document Reviewed: 03/09/2009 [facility] Patient Information 2014 [place], [place]. Back Injury Prevention Back injuries can be extremely painful and difficult to heal. After having one back injury, you are much more likely to experience another later on. It is important to learn how to avoid injuring or re-injuring your back. The following tips can help you to prevent a back injury. PHYSICAL FITNESS  Exercise regularly and try to develop good tone in your abdominal muscles. Your abdominal muscles provide a lot of the support needed by your back.  Do aerobic exercises (walking, jogging, biking, swimming) regularly.  Do exercises that increase balance and strength (tai chi, yoga) regularly. This can decrease your risk of falling and injuring your back.  Stretch before and after exercising.  Maintain a healthy weight. The more you weigh, the more stress is placed on your back. For every pound of weight, 10 times that amount of pressure is placed on the back. DIET  Talk to your caregiver about how much calcium and vitamin D you need per day. These nutrients help to prevent weakening of the bones (osteoporosis). Osteoporosis can cause broken (fractured) bones that lead to back pain.  Include good sources of calcium in your diet, such as dairy products, green, leafy vegetables, and products with calcium added (fortified).  Include good sources of vitamin D in your diet, such as milk and foods that are fortified with vitamin D.  Consider taking a nutritional supplement or a multivitamin if needed.  Stop smoking if you smoke. POSTURE  Sit and stand up straight. Avoid leaning forward  when you sit or hunching over when you stand.  Choose chairs with good low back (lumbar) support.  If you work at a desk, sit close to your work so you do not need to lean over. Keep your chin tucked in. Keep your neck drawn back and elbows bent at a right angle. Your arms should look like the letter "L."  Sit high and close to the steering wheel when you drive. Add a lumbar support to your car seat if needed.  Avoid sitting or standing in one position for too long. Take breaks to get up, stretch, and walk around at least once every hour. Take breaks if you are driving for long periods of time.  Sleep on your side with your knees slightly bent, or sleep on your back with a pillow under your knees. Do not sleep on your stomach. LIFTING, TWISTING, AND REACHING  Avoid heavy lifting, especially repetitive lifting. If you must do heavy lifting:  Stretch before lifting.  Work slowly.  Rest between lifts.  Use carts and dollies to move objects when possible.  Make several small trips instead of carrying 1 heavy load.  Ask for help when you need it.  Ask for help when moving big, awkward objects.  Follow these steps when lifting:  Stand with your feet shoulder-width apart.  Get as close to the object as you can. Do not try to pick up heavy objects that are far from your body.  Use handles or lifting straps if they are available.  Bend at your knees. Squat down, but keep your heels off the floor.  Keep your shoulders pulled back, your chin tucked in, and your back straight.  Lift the object slowly, tightening the muscles in your legs, abdomen, and buttocks. Keep the object as close to the center of your body as possible.  When you put a load down, use these same guidelines in reverse.  Do not:  Lift the object above your waist.  Twist at the waist while lifting or carrying a load. Move your feet if you need to turn, not your waist.  Bend over without bending at your  knees.  Avoid reaching over your head, across a table, or for an object on a high surface. OTHER TIPS  Avoid wet floors and keep sidewalks clear of ice to prevent falls.  Do not sleep on a mattress that is too soft or too hard.  Keep items that are used frequently within easy reach.  Put heavier objects on shelves at waist level and lighter objects on lower or higher shelves.  Find ways to decrease your stress, such as exercise, massage, or relaxation techniques. Stress can build up in your muscles. Tense muscles are more vulnerable to injury.  Seek treatment for depression or anxiety if needed. These conditions can increase your risk of developing back pain. SEEK MEDICAL CARE IF:  You injure your back.  You have questions about diet, exercise, or other ways to prevent back injuries. MAKE SURE YOU:  Understand these instructions.  Will watch your condition.  Will get help right away if you are not doing well or get worse. Document Released: 01/02/2005 Document Revised: 02/17/2012 Document Reviewed: 01/06/2012 [facility] Patient Information 2014 [place], [place].

## 2014-03-25 NOTE — Progress Notes (Signed)
Subjective:    Patient ID: Leah Perez, female    DOB: Oct 09, 1991, 23 y.o.   MRN: [id number]  HPI 23 year old patient who has a history of ADHD and depression.  She presents today with a one-week history of right-sided back pain.  This is fairly diffuse from the interscapular area to the lumbar region.  She has had similar issues on the right side in the past.  There's been no obvious precipitating factors.  Symptoms have been present for about one week and have persisted in spite of decrease in her activities.  Pain is aggravated by movement, such as twisting at the waist it to the left or right bending, stooping, etc.  There is some mild local tenderness.  Past Medical History  Diagnosis Date  . ANXIETY 03/06/2009  . DEPRESSION 03/06/2009  . Migraine   . Depression     History   Social History  . Marital Status: Single    Spouse Name: N/A    Number of Children: N/A  . Years of Education: N/A   Occupational History  . Not on file.   Social History Main Topics  . Smoking status: Current Some Day Smoker    Types: Cigarettes    Last Attempt to Quit: 12/10/2011  . Smokeless tobacco: Never Used  . Alcohol Use: 0.6 oz/week    1 Glasses of wine per week  . Drug Use: Yes    Special: Marijuana  . Sexual Activity: Yes    Birth Control/ Protection: Implant   Other Topics Concern  . Not on file   Social History Narrative   ** Merged History Encounter **        Past Surgical History  Procedure Laterality Date  . Wisdom tooth extraction      Family History  Problem Relation Age of Onset  . Diabetes Father   . Obesity Father   . Obesity Brother   . Alcohol abuse Brother     No Known Allergies  Current Outpatient Prescriptions on File Prior to Visit  Medication Sig Dispense Refill  . ALPRAZolam (XANAX) 0.5 MG tablet Take 1 tablet (0.5 mg total) by mouth 3 (three) times daily as needed for anxiety.  60 tablet  2  . amphetamine-dextroamphetamine (ADDERALL XR) 20  MG 24 hr capsule Take 1 capsule (20 mg total) by mouth every morning.  30 capsule  0  . amphetamine-dextroamphetamine (ADDERALL XR) 20 MG 24 hr capsule Take 1 capsule (20 mg total) by mouth every morning.  30 capsule  0  . amphetamine-dextroamphetamine (ADDERALL XR) 20 MG 24 hr capsule Take 1 capsule (20 mg total) by mouth every morning.  30 capsule  0  . sertraline (ZOLOFT) 100 MG tablet take 1 tablet by mouth once daily  30 tablet  5  . SUMAtriptan (IMITREX) 100 MG tablet Take 1 tablet (100 mg total) by mouth every 2 (two) hours as needed for migraine.  30 tablet  2   No current facility-administered medications on file prior to visit.    BP 120/80  Pulse 70  Temp(Src) 98.2 F (36.8 C) (Oral)  Resp 20  Ht 5\' 5"  (1.651 m)  Wt 154 lb (69.854 kg)  BMI 25.63 kg/m2  SpO2 98%     Review of Systems  Constitutional: Negative.   HENT: Negative for congestion, dental problem, hearing loss, rhinorrhea, sinus pressure, sore throat and tinnitus.   Eyes: Negative for pain, discharge and visual disturbance.  Respiratory: Negative for cough and shortness of breath.  Cardiovascular: Negative for chest pain, palpitations and leg swelling.  Gastrointestinal: Negative for nausea, vomiting, abdominal pain, diarrhea, constipation, blood in stool and abdominal distention.  Genitourinary: Negative for dysuria, urgency, frequency, hematuria, flank pain, vaginal bleeding, vaginal discharge, difficulty urinating, vaginal pain and pelvic pain.  Musculoskeletal: Positive for back pain and myalgias. Negative for arthralgias, gait problem and joint swelling.  Skin: Negative for rash.  Neurological: Negative for dizziness, syncope, speech difficulty, weakness, numbness and headaches.  Hematological: Negative for adenopathy.  Psychiatric/Behavioral: Negative for behavioral problems, dysphoric mood and agitation. The patient is not nervous/anxious.        Objective:   Physical Exam  Constitutional: She  appears well-developed and well-nourished. No distress.  Musculoskeletal:  Mild tenderness along the upper, mid and lower back region.  Pain is aggravated by twisting at the waist          Assessment & Plan:   Musculoligamentous back pain.  The patient is quite concerned about job demands next week at [employer].  We'll treat aggressively with Depo-Medrol.  Heat therapy gentle massage and gentle stretching.

## 2014-03-25 NOTE — Progress Notes (Signed)
Pre-visit discussion using our clinic review tool. No additional management support is needed unless otherwise documented below in the visit note.  

## 2014-05-23 ENCOUNTER — Telehealth: Payer: Self-pay | Admitting: Internal Medicine

## 2014-05-23 NOTE — Telephone Encounter (Signed)
Pt is needing new rx for amphetamine-dextroamphetamine (ADDERALL XR) 20 MG 24 hr capsule, please call when available for pick up.

## 2014-05-24 MED ORDER — AMPHETAMINE-DEXTROAMPHET ER 20 MG PO CP24: 20.0000 mg | ORAL_CAPSULE | ORAL | Status: DC

## 2014-05-24 MED ORDER — AMPHETAMINE-DEXTROAMPHETAMINE 10 MG PO TABS
10.0000 mg | ORAL_TABLET | Freq: Every day | ORAL | Status: DC
Start: 2014-05-24 — End: 2014-07-07

## 2014-05-24 NOTE — Telephone Encounter (Signed)
Pt notified Rx's ready for pickup. Rx's printed and signed.  

## 2014-06-08 ENCOUNTER — Encounter: Payer: Self-pay | Admitting: Internal Medicine

## 2014-06-08 ENCOUNTER — Ambulatory Visit (INDEPENDENT_AMBULATORY_CARE_PROVIDER_SITE_OTHER): Payer: 59 | Admitting: Internal Medicine

## 2014-06-08 VITALS — BP 120/80 | HR 78 | Temp 98.3°F | Resp 18 | Ht 65.0 in | Wt 146.0 lb

## 2014-06-08 DIAGNOSIS — IMO0001 Reserved for inherently not codable concepts without codable children: Secondary | ICD-10-CM

## 2014-06-08 DIAGNOSIS — R3915 Urgency of urination: Secondary | ICD-10-CM

## 2014-06-08 DIAGNOSIS — F329 Major depressive disorder, single episode, unspecified: Secondary | ICD-10-CM

## 2014-06-08 DIAGNOSIS — R300 Dysuria: Secondary | ICD-10-CM

## 2014-06-08 DIAGNOSIS — F3289 Other specified depressive episodes: Secondary | ICD-10-CM

## 2014-06-08 DIAGNOSIS — R350 Frequency of micturition: Secondary | ICD-10-CM

## 2014-06-08 DIAGNOSIS — F909 Attention-deficit hyperactivity disorder, unspecified type: Secondary | ICD-10-CM

## 2014-06-08 MED ORDER — CIPROFLOXACIN HCL 500 MG PO TABS: 500.0000 mg | ORAL_TABLET | Freq: Two times a day (BID) | ORAL | Status: DC

## 2014-06-08 NOTE — Progress Notes (Signed)
Subjective:    Patient ID: Leah Perez, female    DOB: [birth year], 23 y.o.   MRN: [id number]  HPI  23 year old patient who is seen today with a 5-6 day history of burning, dysuria, frequency, and urgency.  She has had UTIs in the past.  She has returned from a recent vacation.  She has ADHD and a history of anxiety, depression, which has improved.  She has had a recent break from a  boyfriend that was not a healthy relationship.  Past Medical History  Diagnosis Date  . ANXIETY 03/06/2009  . DEPRESSION 03/06/2009  . Migraine   . Depression     History   Social History  . Marital Status: Single    Spouse Name: N/A    Number of Children: N/A  . Years of Education: N/A   Occupational History  . Not on file.   Social History Main Topics  . Smoking status: Current Some Day Smoker    Types: Cigarettes    Last Attempt to Quit: 12/10/2011  . Smokeless tobacco: Never Used  . Alcohol Use: 0.6 oz/week    1 Glasses of wine per week  . Drug Use: Yes    Special: Marijuana  . Sexual Activity: Yes    Birth Control/ Protection: Implant   Other Topics Concern  . Not on file   Social History Narrative   ** Merged History Encounter **        Past Surgical History  Procedure Laterality Date  . Wisdom tooth extraction      Family History  Problem Relation Age of Onset  . Diabetes Father   . Obesity Father   . Obesity Brother   . Alcohol abuse Brother     No Known Allergies  Current Outpatient Prescriptions on File Prior to Visit  Medication Sig Dispense Refill  . ALPRAZolam (XANAX) 0.5 MG tablet Take 1 tablet (0.5 mg total) by mouth 3 (three) times daily as needed for anxiety.  60 tablet  2  . amphetamine-dextroamphetamine (ADDERALL XR) 20 MG 24 hr capsule Take 1 capsule (20 mg total) by mouth every morning.  30 capsule  0  . amphetamine-dextroamphetamine (ADDERALL XR) 20 MG 24 hr capsule Take 1 capsule (20 mg total) by mouth every morning.  30 capsule  0  .  amphetamine-dextroamphetamine (ADDERALL XR) 20 MG 24 hr capsule Take 1 capsule (20 mg total) by mouth every morning.  30 capsule  0  . amphetamine-dextroamphetamine (ADDERALL) 10 MG tablet Take 1 tablet (10 mg total) by mouth daily.  30 tablet  0  . sertraline (ZOLOFT) 100 MG tablet take 1 tablet by mouth once daily  30 tablet  5  . SUMAtriptan (IMITREX) 100 MG tablet Take 1 tablet (100 mg total) by mouth every 2 (two) hours as needed for migraine.  30 tablet  2   No current facility-administered medications on file prior to visit.    BP 120/80  Pulse 78  Temp(Src) 98.3 F (36.8 C) (Oral)  Resp 18  Ht 5\' 5"  (1.651 m)  Wt 146 lb (66.225 kg)  BMI 24.30 kg/m2  SpO2 98%    Review of Systems  Constitutional: Negative.   HENT: Negative for congestion, dental problem, hearing loss, rhinorrhea, sinus pressure, sore throat and tinnitus.   Eyes: Negative for pain, discharge and visual disturbance.  Respiratory: Negative for cough and shortness of breath.   Cardiovascular: Negative for chest pain, palpitations and leg swelling.  Gastrointestinal: Negative for nausea, vomiting, abdominal  pain, diarrhea, constipation, blood in stool and abdominal distention.  Genitourinary: Positive for dysuria, urgency and frequency. Negative for hematuria, flank pain, vaginal bleeding, vaginal discharge, difficulty urinating, vaginal pain and pelvic pain.  Musculoskeletal: Negative for arthralgias, gait problem and joint swelling.  Skin: Negative for rash.  Neurological: Negative for dizziness, syncope, speech difficulty, weakness, numbness and headaches.  Hematological: Negative for adenopathy.  Psychiatric/Behavioral: Negative for behavioral problems, dysphoric mood and agitation. The patient is not nervous/anxious.        Objective:   Physical Exam  Constitutional: She is oriented to person, place, and time. She appears well-developed and well-nourished.  HENT:  Head: Normocephalic.  Right Ear:  External ear normal.  Left Ear: External ear normal.  Mouth/Throat: Oropharynx is clear and moist.  Eyes: Conjunctivae and EOM are normal. Pupils are equal, round, and reactive to light.  Neck: Normal range of motion. Neck supple. No thyromegaly present.  Cardiovascular: Normal rate, regular rhythm, normal heart sounds and intact distal pulses.   Pulmonary/Chest: Effort normal and breath sounds normal.  Abdominal: Soft. Bowel sounds are normal. She exhibits no mass. There is no tenderness. There is no rebound and no guarding.  Musculoskeletal: Normal range of motion.  Lymphadenopathy:    She has no cervical adenopathy.  Neurological: She is alert and oriented to person, place, and time.  Skin: Skin is warm and dry. No rash noted.  Psychiatric: She has a normal mood and affect. Her behavior is normal.          Assessment & Plan:   UTI.  Will treat with Cipro for 3 days ADHD, stable Depression stable Anxiety disorder

## 2014-06-08 NOTE — Progress Notes (Signed)
Pre visit review using our clinic review tool, if applicable. No additional management support is needed unless otherwise documented below in the visit note. 

## 2014-06-08 NOTE — Patient Instructions (Signed)
Take your antibiotic as prescribed until ALL of it is gone, but stop if you develop a rash, swelling, or any side effects of the medication.  Contact our office as soon as possible if  there are side effects of the medication.  Urinary Tract Infection Urinary tract infections (UTIs) can develop anywhere along your urinary tract. Your urinary tract is your body's drainage system for removing wastes and extra water. Your urinary tract includes two kidneys, two ureters, a bladder, and a urethra. Your kidneys are a pair of bean-shaped organs. Each kidney is about the size of your fist. They are located below your ribs, one on each side of your spine. CAUSES Infections are caused by microbes, which are microscopic organisms, including fungi, viruses, and bacteria. These organisms are so small that they can only be seen through a microscope. Bacteria are the microbes that most commonly cause UTIs. SYMPTOMS  Symptoms of UTIs may vary by age and gender of the patient and by the location of the infection. Symptoms in young women typically include a frequent and intense urge to urinate and a painful, burning feeling in the bladder or urethra during urination. Older women and men are more likely to be tired, shaky, and weak and have muscle aches and abdominal pain. A fever may mean the infection is in your kidneys. Other symptoms of a kidney infection include pain in your back or sides below the ribs, nausea, and vomiting. DIAGNOSIS To diagnose a UTI, your caregiver will ask you about your symptoms. Your caregiver also will ask to provide a urine sample. The urine sample will be tested for bacteria and white blood cells. White blood cells are made by your body to help fight infection. TREATMENT  Typically, UTIs can be treated with medication. Because most UTIs are caused by a bacterial infection, they usually can be treated with the use of antibiotics. The choice of antibiotic and length of treatment depend on your  symptoms and the type of bacteria causing your infection. HOME CARE INSTRUCTIONS  If you were prescribed antibiotics, take them exactly as your caregiver instructs you. Finish the medication even if you feel better after you have only taken some of the medication.  Drink enough water and fluids to keep your urine clear or pale yellow.  Avoid caffeine, tea, and carbonated beverages. They tend to irritate your bladder.  Empty your bladder often. Avoid holding urine for long periods of time.  Empty your bladder before and after sexual intercourse.  After a bowel movement, women should cleanse from front to back. Use each tissue only once. SEEK MEDICAL CARE IF:   You have back pain.  You develop a fever.  Your symptoms do not begin to resolve within 3 days. SEEK IMMEDIATE MEDICAL CARE IF:   You have severe back pain or lower abdominal pain.  You develop chills.  You have nausea or vomiting.  You have continued burning or discomfort with urination. MAKE SURE YOU:   Understand these instructions.  Will watch your condition.  Will get help right away if you are not doing well or get worse. Document Released: 09/04/2005 Document Revised: 05/26/2012 Document Reviewed: 01/03/2012 Exit[employer] Patient Information 2015 [employer]. This information is not intended to replace advice given to you by your health care provider. Make sure you discuss any questions you have with your health care provider.  

## 2014-06-09 LAB — POCT URINALYSIS DIPSTICK
BILIRUBIN UA: 1
GLUCOSE UA: NEGATIVE
KETONES UA: NEGATIVE
Nitrite, UA: NEGATIVE
PROTEIN UA: 1
Spec Grav, UA: >=1.03
Urobilinogen, UA: 0.2
pH, UA: 5.5

## 2014-06-27 ENCOUNTER — Other Ambulatory Visit: Payer: Self-pay | Admitting: Internal Medicine

## 2014-07-06 ENCOUNTER — Telehealth: Payer: Self-pay | Admitting: Internal Medicine

## 2014-07-06 NOTE — Telephone Encounter (Signed)
Pt following up on request. pls advise. °

## 2014-07-06 NOTE — Telephone Encounter (Signed)
Pt needs new rx generic adderall 10 mg °

## 2014-07-07 MED ORDER — AMPHETAMINE-DEXTROAMPHETAMINE 10 MG PO TABS: 10.0000 mg | ORAL_TABLET | Freq: Every day | ORAL | Status: DC

## 2014-07-07 NOTE — Telephone Encounter (Signed)
Tried to contact pt, unable to leave message voicemail box not set up. Will try again later. Rx ready for pickup. Rx printed and signed by Dr. Durene CalHunter.

## 2014-07-08 NOTE — Telephone Encounter (Signed)
Tried to contact pt again unable to leave message. Rx ready for pickup. 

## 2014-07-08 NOTE — Telephone Encounter (Signed)
Pt cb. Pt aware rx ready for pu

## 2014-08-16 ENCOUNTER — Telehealth: Payer: Self-pay | Admitting: Internal Medicine

## 2014-08-16 MED ORDER — AMPHETAMINE-DEXTROAMPHETAMINE 10 MG PO TABS: 10.0000 mg | ORAL_TABLET | Freq: Every day | ORAL | Status: DC

## 2014-08-16 MED ORDER — AMPHETAMINE-DEXTROAMPHET ER 20 MG PO CP24: 20.0000 mg | ORAL_CAPSULE | ORAL | Status: DC

## 2014-08-16 MED ORDER — ALPRAZOLAM 0.5 MG PO TABS: ORAL_TABLET | ORAL | Status: DC

## 2014-08-16 NOTE — Telephone Encounter (Signed)
Left detailed message Rx's ready for pickup. Rx's printed and signed. 

## 2014-08-16 NOTE — Telephone Encounter (Signed)
Pt request refill of the following:  amphetamine-dextroamphetamine (ADDERALL XR) 20 MG 24 hr capsule, amphetamine-dextroamphetamine (ADDERALL) 10 MG tablet  ALPRAZolam (XANAX) 0.5 MG tablet    Phamacy:  [facility] [address]

## 2014-08-17 ENCOUNTER — Ambulatory Visit: Payer: 59 | Admitting: Internal Medicine

## 2014-10-27 ENCOUNTER — Telehealth: Payer: Self-pay | Admitting: Internal Medicine

## 2014-10-27 NOTE — Telephone Encounter (Signed)
Pt needs new rxs generic adderall xr  20 mg and 10 mg. °

## 2014-10-28 NOTE — Telephone Encounter (Signed)
Left message on voicemail to call office.  

## 2014-10-31 MED ORDER — AMPHETAMINE-DEXTROAMPHET ER 20 MG PO CP24: 20.0000 mg | ORAL_CAPSULE | ORAL | Status: DC

## 2014-10-31 MED ORDER — AMPHETAMINE-DEXTROAMPHETAMINE 10 MG PO TABS: 10.0000 mg | ORAL_TABLET | Freq: Every day | ORAL | Status: DC

## 2014-10-31 NOTE — Telephone Encounter (Signed)
Left message on voicemail to call office.  

## 2014-10-31 NOTE — Telephone Encounter (Signed)
Pt callled back told her she is not due till Nov 30 th. Pt said she is out and she lost last Rx. Told her okay will have Rx's ready later today. Pt verbalized understanding. Rx's printed and signed.

## 2014-11-24 ENCOUNTER — Emergency Department (HOSPITAL_BASED_OUTPATIENT_CLINIC_OR_DEPARTMENT_OTHER)
Admission: EM | Admit: 2014-11-24 | Discharge: 2014-11-24 | Disposition: A | Discharge status: Home / Self Care | Payer: No Typology Code available for payment source | Attending: Emergency Medicine | Admitting: Emergency Medicine

## 2014-11-24 ENCOUNTER — Encounter (HOSPITAL_BASED_OUTPATIENT_CLINIC_OR_DEPARTMENT_OTHER): Payer: Self-pay | Admitting: *Deleted

## 2014-11-24 DIAGNOSIS — Z720 Tobacco use: Secondary | ICD-10-CM | POA: Diagnosis not present

## 2014-11-24 DIAGNOSIS — G43909 Migraine, unspecified, not intractable, without status migrainosus: Secondary | ICD-10-CM | POA: Diagnosis not present

## 2014-11-24 DIAGNOSIS — Y92410 Unspecified street and highway as the place of occurrence of the external cause: Secondary | ICD-10-CM | POA: Insufficient documentation

## 2014-11-24 DIAGNOSIS — S4991XA Unspecified injury of right shoulder and upper arm, initial encounter: Secondary | ICD-10-CM | POA: Diagnosis present

## 2014-11-24 DIAGNOSIS — Z79899 Other long term (current) drug therapy: Secondary | ICD-10-CM | POA: Diagnosis not present

## 2014-11-24 DIAGNOSIS — Y998 Other external cause status: Secondary | ICD-10-CM | POA: Diagnosis not present

## 2014-11-24 DIAGNOSIS — M62838 Other muscle spasm: Secondary | ICD-10-CM

## 2014-11-24 DIAGNOSIS — Z792 Long term (current) use of antibiotics: Secondary | ICD-10-CM | POA: Diagnosis not present

## 2014-11-24 DIAGNOSIS — S199XXA Unspecified injury of neck, initial encounter: Secondary | ICD-10-CM | POA: Insufficient documentation

## 2014-11-24 DIAGNOSIS — Y9389 Activity, other specified: Secondary | ICD-10-CM | POA: Diagnosis not present

## 2014-11-24 DIAGNOSIS — F419 Anxiety disorder, unspecified: Secondary | ICD-10-CM | POA: Insufficient documentation

## 2014-11-24 DIAGNOSIS — S46911A Strain of unspecified muscle, fascia and tendon at shoulder and upper arm level, right arm, initial encounter: Secondary | ICD-10-CM | POA: Diagnosis not present

## 2014-11-24 DIAGNOSIS — F329 Major depressive disorder, single episode, unspecified: Secondary | ICD-10-CM | POA: Diagnosis not present

## 2014-11-24 MED ORDER — METHOCARBAMOL 500 MG PO TABS
1000.0000 mg | ORAL_TABLET | Freq: Once | ORAL | Status: DC
  Filled 2014-11-24: qty 2

## 2014-11-24 MED ORDER — NAPROXEN 250 MG PO TABS
500.0000 mg | ORAL_TABLET | Freq: Once | ORAL | Status: AC
  Administered 2014-11-24: 500 mg via ORAL

## 2014-11-24 MED ORDER — NAPROXEN 250 MG PO TABS
ORAL_TABLET | ORAL | Status: AC
  Filled 2014-11-24: qty 2

## 2014-11-24 MED ORDER — NAPROXEN 500 MG PO TABS: 500.0000 mg | ORAL_TABLET | Freq: Two times a day (BID) | ORAL | Status: DC

## 2014-11-24 MED ORDER — METHOCARBAMOL 500 MG PO TABS
ORAL_TABLET | ORAL | Status: AC
  Filled 2014-11-24: qty 1

## 2014-11-24 MED ORDER — METHOCARBAMOL 500 MG PO TABS: 1000.0000 mg | ORAL_TABLET | Freq: Four times a day (QID) | ORAL | Status: DC
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
## 2014-11-28 ENCOUNTER — Other Ambulatory Visit: Payer: Self-pay | Admitting: Internal Medicine

## 2014-12-09 NOTE — L&D Delivery Note (Signed)
Delivery Note At 6:30 AM a viable and healthy female was delivered via Vaginal, Spontaneous Delivery (Presentation: OA ).  APGAR: 8, 9; weight  .   Placenta status: Intact, Spontaneous.  Cord: 3 vessels with the following complications: None.  Family and FOB at bedside. Mother tolerated delivery very well.   Sebaceous Cyst noted at introitus. Dr. Despina Hidden was asked to help assess/remove. Cyst was removed w/ 2cc lidocaine and base was tied off w/ suture. Cyst ruptured, draining purulent fluid during this process. No bleeding or complication experienced during this procedure.  Anesthesia: Epidural  Episiotomy:  no Lacerations:  Minimal 1st degree Suture Repair: no suture repair deemed necessary Est. Blood Loss (mL):  150  Mom to postpartum.  Baby to Couplet care / Skin to Skin.  Mickie Hillier 09/10/2015, 6:59 AM

## 2014-12-22 ENCOUNTER — Ambulatory Visit: Payer: Self-pay | Admitting: Internal Medicine

## 2014-12-29 ENCOUNTER — Encounter: Payer: Self-pay | Admitting: Internal Medicine

## 2014-12-29 ENCOUNTER — Ambulatory Visit (INDEPENDENT_AMBULATORY_CARE_PROVIDER_SITE_OTHER): Payer: 59 | Admitting: Internal Medicine

## 2014-12-29 VITALS — BP 120/80 | HR 124 | Temp 98.3°F | Resp 20 | Ht 64.0 in | Wt 153.0 lb

## 2014-12-29 DIAGNOSIS — F900 Attention-deficit hyperactivity disorder, predominantly inattentive type: Secondary | ICD-10-CM

## 2014-12-29 DIAGNOSIS — M25511 Pain in right shoulder: Secondary | ICD-10-CM

## 2014-12-29 DIAGNOSIS — F411 Generalized anxiety disorder: Secondary | ICD-10-CM

## 2014-12-29 DIAGNOSIS — Z8669 Personal history of other diseases of the nervous system and sense organs: Secondary | ICD-10-CM

## 2014-12-29 MED ORDER — CYCLOBENZAPRINE HCL 5 MG PO TABS: 5.0000 mg | ORAL_TABLET | Freq: Three times a day (TID) | ORAL | Status: DC | PRN

## 2014-12-29 MED ORDER — TRAMADOL HCL 50 MG PO TABS
50.0000 mg | ORAL_TABLET | Freq: Four times a day (QID) | ORAL | Status: DC | PRN
Start: 2014-12-29 — End: 2015-05-31

## 2014-12-29 NOTE — Progress Notes (Signed)
Pre visit review using our clinic review tool, if applicable. No additional management support is needed unless otherwise documented below in the visit note. 
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
## 2014-12-29 NOTE — Patient Instructions (Addendum)
Orthopedic referral as discussedCervical Strain and Sprain (Whiplash) with Rehab Cervical strain and sprain are injuries that commonly occur with "whiplash" injuries. Whiplash occurs when the neck is forcefully whipped backward or forward, such as during a motor vehicle accident or during contact sports. The muscles, ligaments, tendons, discs, and nerves of the neck are susceptible to injury when this occurs. RISK FACTORS Risk of having a whiplash injury increases if:  Osteoarthritis of the spine.  Situations that make head or neck accidents or trauma more likely.  High-risk sports (football, rugby, wrestling, hockey, auto racing, gymnastics, diving, contact karate, or boxing).  Poor strength and flexibility of the neck.  Previous neck injury.  Poor tackling technique.  Improperly fitted or padded equipment. SYMPTOMS   Pain or stiffness in the front or back of neck or both.  Symptoms may present immediately or up to 24 hours after injury.  Dizziness, headache, nausea, and vomiting.  Muscle spasm with soreness and stiffness in the neck.  Tenderness and swelling at the injury site. PREVENTION  Learn and use proper technique (avoid tackling with the head, spearing, and head-butting; use proper falling techniques to avoid landing on the head).  Warm up and stretch properly before activity.  Maintain physical fitness:  Strength, flexibility, and endurance.  Cardiovascular fitness.  Wear properly fitted and padded protective equipment, such as padded soft collars, for participation in contact sports. PROGNOSIS  Recovery from cervical strain and sprain injuries is dependent on the extent of the injury. These injuries are usually curable in 1 week to 3 months with appropriate treatment.  RELATED COMPLICATIONS   Temporary numbness and weakness may occur if the nerve roots are damaged, and this may persist until the nerve has completely healed.  Chronic pain due to frequent  recurrence of symptoms.  Prolonged healing, especially if activity is resumed too soon (before complete recovery). TREATMENT  Treatment initially involves the use of ice and medication to help reduce pain and inflammation. It is also important to perform strengthening and stretching exercises and modify activities that worsen symptoms so the injury does not get worse. These exercises may be performed at home or with a therapist. For patients who experience severe symptoms, a soft, padded collar may be recommended to be worn around the neck.  Improving your posture may help reduce symptoms. Posture improvement includes pulling your chin and abdomen in while sitting or standing. If you are sitting, sit in a firm chair with your buttocks against the back of the chair. While sleeping, try replacing your pillow with a small towel rolled to 2 inches in diameter, or use a cervical pillow or soft cervical collar. Poor sleeping positions delay healing.  For patients with nerve root damage, which causes numbness or weakness, the use of a cervical traction apparatus may be recommended. Surgery is rarely necessary for these injuries. However, cervical strain and sprains that are present at birth (congenital) may require surgery. MEDICATION   If pain medication is necessary, nonsteroidal anti-inflammatory medications, such as aspirin and ibuprofen, or other minor pain relievers, such as acetaminophen, are often recommended.  Do not take pain medication for 7 days before surgery.  Prescription pain relievers may be given if deemed necessary by your caregiver. Use only as directed and only as much as you need. HEAT AND COLD:   Cold treatment (icing) relieves pain and reduces inflammation. Cold treatment should be applied for 10 to 15 minutes every 2 to 3 hours for inflammation and pain and immediately after any  activity that aggravates your symptoms. Use ice packs or an ice massage.  Heat treatment may be used  prior to performing the stretching and strengthening activities prescribed by your caregiver, physical therapist, or athletic trainer. Use a heat pack or a warm soak. SEEK MEDICAL CARE IF:   Symptoms get worse or do not improve in 2 weeks despite treatment.  New, unexplained symptoms develop (drugs used in treatment may produce side effects). EXERCISES RANGE OF MOTION (ROM) AND STRETCHING EXERCISES - Cervical Strain and Sprain These exercises may help you when beginning to rehabilitate your injury. In order to successfully resolve your symptoms, you must improve your posture. These exercises are designed to help reduce the forward-head and rounded-shoulder posture which contributes to this condition. Your symptoms may resolve with or without further involvement from your physician, physical therapist or athletic trainer. While completing these exercises, remember:   Restoring tissue flexibility helps normal motion to return to the joints. This allows healthier, less painful movement and activity.  An effective stretch should be held for at least 20 seconds, although you may need to begin with shorter hold times for comfort.  A stretch should never be painful. You should only feel a gentle lengthening or release in the stretched tissue. STRETCH- Axial Extensors  Lie on your back on the floor. You may bend your knees for comfort. Place a rolled-up hand towel or dish towel, about 2 inches in diameter, under the part of your head that makes contact with the floor.  Gently tuck your chin, as if trying to make a "double chin," until you feel a gentle stretch at the base of your head.  Hold __________ seconds. Repeat __________ times. Complete this exercise __________ times per day.  STRETCH - Axial Extension   Stand or sit on a firm surface. Assume a good posture: chest up, shoulders drawn back, abdominal muscles slightly tense, knees unlocked (if standing) and feet hip width apart.  Slowly  retract your chin so your head slides back and your chin slightly lowers. Continue to look straight ahead.  You should feel a gentle stretch in the back of your head. Be certain not to feel an aggressive stretch since this can cause headaches later.  Hold for __________ seconds. Repeat __________ times. Complete this exercise __________ times per day. STRETCH - Cervical Side Bend   Stand or sit on a firm surface. Assume a good posture: chest up, shoulders drawn back, abdominal muscles slightly tense, knees unlocked (if standing) and feet hip width apart.  Without letting your nose or shoulders move, slowly tip your right / left ear to your shoulder until your feel a gentle stretch in the muscles on the opposite side of your neck.  Hold __________ seconds. Repeat __________ times. Complete this exercise __________ times per day. STRETCH - Cervical Rotators   Stand or sit on a firm surface. Assume a good posture: chest up, shoulders drawn back, abdominal muscles slightly tense, knees unlocked (if standing) and feet hip width apart.  Keeping your eyes level with the ground, slowly turn your head until you feel a gentle stretch along the back and opposite side of your neck.  Hold __________ seconds. Repeat __________ times. Complete this exercise __________ times per day. RANGE OF MOTION - Neck Circles   Stand or sit on a firm surface. Assume a good posture: chest up, shoulders drawn back, abdominal muscles slightly tense, knees unlocked (if standing) and feet hip width apart.  Gently roll your head down and  around from the back of one shoulder to the back of the other. The motion should never be forced or painful.  Repeat the motion 10-20 times, or until you feel the neck muscles relax and loosen. Repeat __________ times. Complete the exercise __________ times per day. STRENGTHENING EXERCISES - Cervical Strain and Sprain These exercises may help you when beginning to rehabilitate your  injury. They may resolve your symptoms with or without further involvement from your physician, physical therapist, or athletic trainer. While completing these exercises, remember:   Muscles can gain both the endurance and the strength needed for everyday activities through controlled exercises.  Complete these exercises as instructed by your physician, physical therapist, or athletic trainer. Progress the resistance and repetitions only as guided.  You may experience muscle soreness or fatigue, but the pain or discomfort you are trying to eliminate should never worsen during these exercises. If this pain does worsen, stop and make certain you are following the directions exactly. If the pain is still present after adjustments, discontinue the exercise until you can discuss the trouble with your clinician. STRENGTH - Cervical Flexors, Isometric  Face a wall, standing about 6 inches away. Place a small pillow, a ball about 6-8 inches in diameter, or a folded towel between your forehead and the wall.  Slightly tuck your chin and gently push your forehead into the soft object. Push only with mild to moderate intensity, building up tension gradually. Keep your jaw and forehead relaxed.  Hold 10 to 20 seconds. Keep your breathing relaxed.  Release the tension slowly. Relax your neck muscles completely before you start the next repetition. Repeat __________ times. Complete this exercise __________ times per day. STRENGTH- Cervical Lateral Flexors, Isometric   Stand about 6 inches away from a wall. Place a small pillow, a ball about 6-8 inches in diameter, or a folded towel between the side of your head and the wall.  Slightly tuck your chin and gently tilt your head into the soft object. Push only with mild to moderate intensity, building up tension gradually. Keep your jaw and forehead relaxed.  Hold 10 to 20 seconds. Keep your breathing relaxed.  Release the tension slowly. Relax your neck  muscles completely before you start the next repetition. Repeat __________ times. Complete this exercise __________ times per day. STRENGTH - Cervical Extensors, Isometric   Stand about 6 inches away from a wall. Place a small pillow, a ball about 6-8 inches in diameter, or a folded towel between the back of your head and the wall.  Slightly tuck your chin and gently tilt your head back into the soft object. Push only with mild to moderate intensity, building up tension gradually. Keep your jaw and forehead relaxed.  Hold 10 to 20 seconds. Keep your breathing relaxed.  Release the tension slowly. Relax your neck muscles completely before you start the next repetition. Repeat __________ times. Complete this exercise __________ times per day. POSTURE AND BODY MECHANICS CONSIDERATIONS - Cervical Strain and Sprain Keeping correct posture when sitting, standing or completing your activities will reduce the stress put on different body tissues, allowing injured tissues a chance to heal and limiting painful experiences. The following are general guidelines for improved posture. Your physician or physical therapist will provide you with any instructions specific to your needs. While reading these guidelines, remember:  The exercises prescribed by your provider will help you have the flexibility and strength to maintain correct postures.  The correct posture provides the optimal environment for  your joints to work. All of your joints have less wear and tear when properly supported by a spine with good posture. This means you will experience a healthier, less painful body.  Correct posture must be practiced with all of your activities, especially prolonged sitting and standing. Correct posture is as important when doing repetitive low-stress activities (typing) as it is when doing a single heavy-load activity (lifting). PROLONGED STANDING WHILE SLIGHTLY LEANING FORWARD When completing a task that requires  you to lean forward while standing in one place for a long time, place either foot up on a stationary 2- to 4-inch high object to help maintain the best posture. When both feet are on the ground, the low back tends to lose its slight inward curve. If this curve flattens (or becomes too large), then the back and your other joints will experience too much stress, fatigue more quickly, and can cause pain.  RESTING POSITIONS Consider which positions are most painful for you when choosing a resting position. If you have pain with flexion-based activities (sitting, bending, stooping, squatting), choose a position that allows you to rest in a less flexed posture. You would want to avoid curling into a fetal position on your side. If your pain worsens with extension-based activities (prolonged standing, working overhead), avoid resting in an extended position such as sleeping on your stomach. Most people will find more comfort when they rest with their spine in a more neutral position, neither too rounded nor too arched. Lying on a non-sagging bed on your side with a pillow between your knees, or on your back with a pillow under your knees will often provide some relief. Keep in mind, being in any one position for a prolonged period of time, no matter how correct your posture, can still lead to stiffness. WALKING Walk with an upright posture. Your ears, shoulders, and hips should all line up. OFFICE WORK When working at a desk, create an environment that supports good, upright posture. Without extra support, muscles fatigue and lead to excessive strain on joints and other tissues. CHAIR:  A chair should be able to slide under your desk when your back makes contact with the back of the chair. This allows you to work closely.  The chair's height should allow your eyes to be level with the upper part of your monitor and your hands to be slightly lower than your elbows.  Body position:  Your feet should make  contact with the floor. If this is not possible, use a foot rest.  Keep your ears over your shoulders. This will reduce stress on your neck and low back. Document Released: 11/25/2005 Document Revised: 04/11/2014 Document Reviewed: 03/09/2009 [facility] Patient Information 2015 [place], [place]. This information is not intended to replace advice given to you by your health care provider. Make sure you discuss any questions you have with your health care provider. Rotator Cuff Injury Rotator cuff injury is any type of injury to the set of muscles and tendons that make up the stabilizing unit of your shoulder. This unit holds the ball of your upper arm bone (humerus) in the socket of your shoulder blade (scapula).  CAUSES Injuries to your rotator cuff most commonly come from sports or activities that cause your arm to be moved repeatedly over your head. Examples of this include throwing, weight lifting, swimming, or racquet sports. Long lasting (chronic) irritation of your rotator cuff can cause soreness and swelling (inflammation), bursitis, and eventual damage to your tendons, such as  a tear (rupture). SIGNS AND SYMPTOMS Acute rotator cuff tear:  Sudden tearing sensation followed by severe pain shooting from your upper shoulder down your arm toward your elbow.  Decreased range of motion of your shoulder because of pain and muscle spasm.  Severe pain.  Inability to raise your arm out to the side because of pain and loss of muscle power (large tears). Chronic rotator cuff tear:  Pain that usually is worse at night and may interfere with sleep.  Gradual weakness and decreased shoulder motion as the pain worsens.  Decreased range of motion. Rotator cuff tendinitis:  Deep ache in your shoulder and the outside upper arm over your shoulder.  Pain that comes on gradually and becomes worse when lifting your arm to the side or turning it inward. DIAGNOSIS Rotator cuff injury is diagnosed through  a medical history, physical exam, and imaging exam. The medical history helps determine the type of rotator cuff injury. Your health care provider will look at your injured shoulder, feel the injured area, and ask you to move your shoulder in different positions. X-ray exams typically are done to rule out other causes of shoulder pain, such as fractures. MRI is the exam of choice for the most severe shoulder injuries because the images show muscles and tendons.  TREATMENT  Chronic tear:  Medicine for pain, such as acetaminophen or ibuprofen.  Physical therapy and range-of-motion exercises may be helpful in maintaining shoulder function and strength.  Steroid injections into your shoulder joint.  Surgical repair of the rotator cuff if the injury does not heal with noninvasive treatment. Acute tear:  Anti-inflammatory medicines such as ibuprofen and naproxen to help reduce pain and swelling.  A sling to help support your arm and rest your rotator cuff muscles. Long-term use of a sling is not advised. It may cause significant stiffening of the shoulder joint.  Surgery may be considered within a few weeks, especially in younger, active people, to return the shoulder to full function.  Indications for surgical treatment include the following:  Age younger than 60 years.  Rotator cuff tears that are complete.  Physical therapy, rest, and anti-inflammatory medicines have been used for 6-8 weeks, with no improvement.  Employment or sporting activity that requires constant shoulder use. Tendinitis:  Anti-inflammatory medicines such as ibuprofen and naproxen to help reduce pain and swelling.  A sling to help support your arm and rest your rotator cuff muscles. Long-term use of a sling is not advised. It may cause significant stiffening of the shoulder joint.  Severe tendinitis may require:  Steroid injections into your shoulder joint.  Physical therapy.  Surgery. HOME CARE INSTRUCTIONS    Apply ice to your injury:  Put ice in a plastic bag.  Place a towel between your skin and the bag.  Leave the ice on for 20 minutes, 2-3 times a day.  If you have a shoulder immobilizer (sling and straps), wear it until told otherwise by your health care provider.  You may want to sleep on several pillows or in a recliner at night to lessen swelling and pain.  Only take over-the-counter or prescription medicines for pain, discomfort, or fever as directed by your health care provider.  Do simple hand squeezing exercises with a soft rubber ball to decrease hand swelling. SEEK MEDICAL CARE IF:   Your shoulder pain increases, or new pain or numbness develops in your arm, hand, or fingers.  Your hand or fingers are colder than your other hand. SEEK  IMMEDIATE MEDICAL CARE IF:   Your arm, hand, or fingers are numb or tingling.  Your arm, hand, or fingers are increasingly swollen and painful, or they turn white or blue. MAKE SURE YOU:  Understand these instructions.  Will watch your condition.  Will get help right away if you are not doing well or get worse. Document Released: 11/22/2000 Document Revised: 11/30/2013 Document Reviewed: 07/07/2013 [facility] Patient Information 2015 [place]. This information is not intended to replace advice given to you by your health care provider. Make sure you discuss any questions you have with your health care provider.

## 2015-01-11 ENCOUNTER — Telehealth: Payer: Self-pay | Admitting: Internal Medicine

## 2015-01-11 ENCOUNTER — Ambulatory Visit (INDEPENDENT_AMBULATORY_CARE_PROVIDER_SITE_OTHER): Payer: 59 | Admitting: Internal Medicine

## 2015-01-11 ENCOUNTER — Encounter: Payer: Self-pay | Admitting: *Deleted

## 2015-01-11 ENCOUNTER — Encounter: Payer: Self-pay | Admitting: Internal Medicine

## 2015-01-11 VITALS — BP 136/74 | HR 100 | Temp 98.0°F | Resp 20 | Ht 64.0 in | Wt 154.0 lb

## 2015-01-11 DIAGNOSIS — B9789 Other viral agents as the cause of diseases classified elsewhere: Principal | ICD-10-CM

## 2015-01-11 DIAGNOSIS — J069 Acute upper respiratory infection, unspecified: Secondary | ICD-10-CM

## 2015-01-11 NOTE — Telephone Encounter (Signed)
emmi emailed °

## 2015-01-11 NOTE — Patient Instructions (Signed)
PREVENTION  The best way to protect against getting a cold is to practice good hygiene. Avoid oral or hand contact with people with cold symptoms. Wash your hands often if contact occurs. There is no clear evidence that vitamin C, vitamin E, echinacea, or exercise reduces the chance of developing a cold. However, it is always recommended to get plenty of rest and practice good nutrition.  TREATMENT  Treatment is directed at relieving symptoms. There is no cure. Antibiotics are not effective, because the infection is caused by a virus, not by bacteria. Treatment may include:  Increased fluid intake. Sports drinks offer valuable electrolytes, sugars, and fluids.  Breathing heated mist or steam (vaporizer or shower).  Eating chicken soup or other clear broths, and maintaining good nutrition.  Getting plenty of rest.  Using gargles or lozenges for comfort.  Controlling fevers with ibuprofen or acetaminophen as directed by your caregiver.  Increasing usage of your inhaler if you have asthma. Zinc gel and zinc lozenges, taken in the first 24 hours of the common cold, can shorten the duration and lessen the severity of symptoms. Pain medicines may help with fever, muscle aches, and throat pain. A variety of non-prescription medicines are available to treat congestion and runny nose. Your caregiver can make recommendations and may suggest nasal or lung inhalers for other symptoms.  HOME CARE INSTRUCTIONS  Only take over-the-counter or prescription medicines for pain, discomfort, or fever as directed by your caregiver.  Use a warm mist humidifier or inhale steam from a shower to increase air moisture. This may keep secretions moist and make it easier to breathe.  Drink enough water and fluids to keep your urine clear or pale yellow.  Rest as needed.  Return to work when your temperature has returned to normal or as your caregiver advises. You may need to stay home longer to avoid infecting others. You can  also use a face mask and careful hand washing to prevent spread of the virus.

## 2015-01-11 NOTE — Progress Notes (Signed)
Subjective:    Patient ID: Leah Perez, female    DOB: [birth year], 24 y.o.   MRN: [id number]  HPI  24 year old patient who presents with a 2 day history of sinus congestion, drainage, sore throat and cough.  She has been using over-the-counter Rx with minimal benefit.  She generally feels well but works in [employer] and was sent home due to rhinorrhea, cough and sneezing.  No fever  Past Medical History  Diagnosis Date  . ANXIETY 03/06/2009  . DEPRESSION 03/06/2009  . Migraine   . Depression     History   Social History  . Marital Status: Single    Spouse Name: N/A    Number of Children: N/A  . Years of Education: N/A   Occupational History  . Not on file.   Social History Main Topics  . Smoking status: Current Some Day Smoker    Types: Cigarettes    Last Attempt to Quit: 12/10/2011  . Smokeless tobacco: Never Used  . Alcohol Use: 0.6 oz/week    1 Glasses of wine per week  . Drug Use: Yes    Special: Marijuana  . Sexual Activity: Yes    Birth Control/ Protection: Implant   Other Topics Concern  . Not on file   Social History Narrative   ** Merged History Encounter **        Past Surgical History  Procedure Laterality Date  . Wisdom tooth extraction      Family History  Problem Relation Age of Onset  . Diabetes Father   . Obesity Father   . Obesity Brother   . Alcohol abuse Brother     No Known Allergies  Current Outpatient Prescriptions on File Prior to Visit  Medication Sig Dispense Refill  . ALPRAZolam (XANAX) 0.5 MG tablet take 1 tablet by mouth three times a day if needed 60 tablet 2  . amphetamine-dextroamphetamine (ADDERALL XR) 20 MG 24 hr capsule Take 1 capsule (20 mg total) by mouth every morning. 30 capsule 0  . amphetamine-dextroamphetamine (ADDERALL XR) 20 MG 24 hr capsule Take 1 capsule (20 mg total) by mouth every morning. 30 capsule 0  . amphetamine-dextroamphetamine (ADDERALL XR) 20 MG 24 hr capsule Take 1 capsule (20 mg  total) by mouth every morning. 30 capsule 0  . amphetamine-dextroamphetamine (ADDERALL) 10 MG tablet Take 1 tablet (10 mg total) by mouth daily. 30 tablet 0  . amphetamine-dextroamphetamine (ADDERALL) 10 MG tablet Take 1 tablet (10 mg total) by mouth daily. 30 tablet 0  . amphetamine-dextroamphetamine (ADDERALL) 10 MG tablet Take 1 tablet (10 mg total) by mouth daily. 30 tablet 0  . cyclobenzaprine (FLEXERIL) 5 MG tablet Take 1 tablet (5 mg total) by mouth 3 (three) times daily as needed for muscle spasms. 30 tablet 1  . sertraline (ZOLOFT) 100 MG tablet take 1 tablet by mouth once daily 30 tablet 5  . SUMAtriptan (IMITREX) 100 MG tablet Take 1 tablet (100 mg total) by mouth every 2 (two) hours as needed for migraine. 30 tablet 2  . traMADol (ULTRAM) 50 MG tablet Take 1 tablet (50 mg total) by mouth every 6 (six) hours as needed. 60 tablet 0   No current facility-administered medications on file prior to visit.    BP 136/74 mmHg  Pulse 100  Temp(Src) 98 F (36.7 C) (Oral)  Resp 20  Ht  (1.626 m)  Wt 154 lb (69.854 kg)  BMI 26.42 kg/m2  SpO2 98%  Review of Systems  Constitutional: Negative.  Negative for fever.  HENT: Positive for congestion, postnasal drip, rhinorrhea, sinus pressure, sneezing and sore throat. Negative for dental problem, hearing loss and tinnitus.   Eyes: Negative for pain, discharge and visual disturbance.  Respiratory: Positive for cough. Negative for shortness of breath.   Cardiovascular: Negative for chest pain, palpitations and leg swelling.  Gastrointestinal: Negative for nausea, vomiting, abdominal pain, diarrhea, constipation, blood in stool and abdominal distention.  Genitourinary: Negative for dysuria, urgency, frequency, hematuria, flank pain, vaginal bleeding, vaginal discharge, difficulty urinating, vaginal pain and pelvic pain.  Musculoskeletal: Negative for joint swelling, arthralgias and gait problem.  Skin: Negative for rash.    Neurological: Negative for dizziness, syncope, speech difficulty, weakness, numbness and headaches.  Hematological: Negative for adenopathy.  Psychiatric/Behavioral: Negative for behavioral problems, dysphoric mood and agitation. The patient is not nervous/anxious.        Objective:   Physical Exam  Constitutional: She is oriented to person, place, and time. She appears well-developed and well-nourished.  HENT:  Head: Normocephalic.  Right Ear: External ear normal.  Left Ear: External ear normal.  Mouth/Throat: Oropharynx is clear and moist.  Eyes: Conjunctivae and EOM are normal. Pupils are equal, round, and reactive to light.  Neck: Normal range of motion. Neck supple. No thyromegaly present.  Cardiovascular: Normal rate, regular rhythm, normal heart sounds and intact distal pulses.   Pulmonary/Chest: Effort normal and breath sounds normal. No respiratory distress. She has no wheezes. She has no rales.  Abdominal: Soft. Bowel sounds are normal. She exhibits no mass. There is no tenderness.  Musculoskeletal: Normal range of motion.  Lymphadenopathy:    She has no cervical adenopathy.  Neurological: She is alert and oriented to person, place, and time.  Skin: Skin is warm and dry. No rash noted.  Psychiatric: She has a normal mood and affect. Her behavior is normal.          Assessment & Plan:    Viral URI with cough.  Will treat symptomatically

## 2015-01-11 NOTE — Progress Notes (Signed)
Pre visit review using our clinic review tool, if applicable. No additional management support is needed unless otherwise documented below in the visit note. 

## 2015-02-01 ENCOUNTER — Encounter: Payer: Self-pay | Admitting: Internal Medicine

## 2015-02-01 ENCOUNTER — Ambulatory Visit (INDEPENDENT_AMBULATORY_CARE_PROVIDER_SITE_OTHER): Payer: 59 | Admitting: Internal Medicine

## 2015-02-01 VITALS — BP 130/70 | HR 73 | Temp 98.6°F | Resp 20 | Ht 64.0 in | Wt 151.0 lb

## 2015-02-01 DIAGNOSIS — R110 Nausea: Secondary | ICD-10-CM

## 2015-02-01 DIAGNOSIS — Z8669 Personal history of other diseases of the nervous system and sense organs: Secondary | ICD-10-CM

## 2015-02-01 LAB — HCG, QUANTITATIVE, PREGNANCY: Quantitative HCG: >1358 m[IU]/mL

## 2015-02-01 MED ORDER — SUMATRIPTAN SUCCINATE 100 MG PO TABS
100.0000 mg | ORAL_TABLET | ORAL | Status: DC | PRN
Start: 2015-02-01 — End: 2015-05-31

## 2015-02-01 MED ORDER — AMPHETAMINE-DEXTROAMPHET ER 20 MG PO CP24: 20.0000 mg | ORAL_CAPSULE | ORAL | Status: DC

## 2015-02-01 MED ORDER — SERTRALINE HCL 100 MG PO TABS: ORAL_TABLET | ORAL | Status: DC

## 2015-02-01 MED ORDER — AMPHETAMINE-DEXTROAMPHETAMINE 10 MG PO TABS: 10.0000 mg | ORAL_TABLET | Freq: Every day | ORAL | Status: DC

## 2015-02-01 MED ORDER — ONDANSETRON HCL 8 MG PO TABS: 8.0000 mg | ORAL_TABLET | Freq: Three times a day (TID) | ORAL | Status: DC | PRN

## 2015-02-01 MED ORDER — ALPRAZOLAM 0.5 MG PO TABS: ORAL_TABLET | ORAL | Status: DC

## 2015-02-01 NOTE — Progress Notes (Signed)
Pre visit review using our clinic review tool, if applicable. No additional management support is needed unless otherwise documented below in the visit note. 

## 2015-02-01 NOTE — Patient Instructions (Signed)

## 2015-02-01 NOTE — Progress Notes (Signed)
Subjective:    Patient ID: Leah Perez, female    DOB: 1991-03-01, 24 y.o.   MRN: [id number]  HPI  24 year old patient who presents with a chief complaint of persistent nausea.  This has been present for 8 days.  Last week she did have 24 hours of nausea, vomiting, diarrhea with fever.  At the present time.  Nausea is her only complaint.  She has been using Phenergan with some benefit. No birth control measures.  She has noticed some mild breast tenderness.  She states her menstrual cycles always quite irregular and has noted no particular change.  Past Medical History  Diagnosis Date  . ANXIETY 03/06/2009  . DEPRESSION 03/06/2009  . Migraine   . Depression     History   Social History  . Marital Status: Single    Spouse Name: N/A  . Number of Children: N/A  . Years of Education: N/A   Occupational History  . Not on file.   Social History Main Topics  . Smoking status: Current Some Day Smoker    Types: Cigarettes    Last Attempt to Quit: 12/10/2011  . Smokeless tobacco: Never Used  . Alcohol Use: 0.6 oz/week    1 Glasses of wine per week  . Drug Use: Yes    Special: Marijuana  . Sexual Activity: Yes    Birth Control/ Protection: Implant   Other Topics Concern  . Not on file   Social History Narrative   ** Merged History Encounter **        Past Surgical History  Procedure Laterality Date  . Wisdom tooth extraction      Family History  Problem Relation Age of Onset  . Diabetes Father   . Obesity Father   . Obesity Brother   . Alcohol abuse Brother     No Known Allergies  Current Outpatient Prescriptions on File Prior to Visit  Medication Sig Dispense Refill  . cyclobenzaprine (FLEXERIL) 5 MG tablet Take 1 tablet (5 mg total) by mouth 3 (three) times daily as needed for muscle spasms. 30 tablet 1  . traMADol (ULTRAM) 50 MG tablet Take 1 tablet (50 mg total) by mouth every 6 (six) hours as needed. 60 tablet 0   No current facility-administered  medications on file prior to visit.    BP 130/70 mmHg  Pulse 73  Temp(Src) 98.6 F (37 C) (Oral)  Resp 20  Ht 5\' 4"  (1.626 m)  Wt 151 lb (68.493 kg)  BMI 25.91 kg/m2  SpO2 99%      Review of Systems  Constitutional: Negative.   HENT: Negative for congestion, dental problem, hearing loss, rhinorrhea, sinus pressure, sore throat and tinnitus.   Eyes: Negative for pain, discharge and visual disturbance.  Respiratory: Negative for cough and shortness of breath.   Cardiovascular: Negative for chest pain, palpitations and leg swelling.  Gastrointestinal: Positive for nausea. Negative for vomiting, abdominal pain, diarrhea, constipation, blood in stool and abdominal distention.  Genitourinary: Negative for dysuria, urgency, frequency, hematuria, flank pain, vaginal bleeding, vaginal discharge, difficulty urinating, vaginal pain and pelvic pain.  Musculoskeletal: Negative for joint swelling, arthralgias and gait problem.  Skin: Negative for rash.  Neurological: Negative for dizziness, syncope, speech difficulty, weakness, numbness and headaches.  Hematological: Negative for adenopathy.  Psychiatric/Behavioral: Negative for behavioral problems, dysphoric mood and agitation. The patient is not nervous/anxious.        Objective:   Physical Exam  Constitutional: She is oriented to person, place, and time.  She appears well-developed and well-nourished.  HENT:  Head: Normocephalic.  Right Ear: External ear normal.  Left Ear: External ear normal.  Mouth/Throat: Oropharynx is clear and moist.  Eyes: Conjunctivae and EOM are normal. Pupils are equal, round, and reactive to light.  Neck: Normal range of motion. Neck supple. No thyromegaly present.  Cardiovascular: Normal rate, regular rhythm, normal heart sounds and intact distal pulses.   Pulmonary/Chest: Effort normal and breath sounds normal.  Abdominal: Soft. Bowel sounds are normal. She exhibits no distension and no mass. There is no  tenderness. There is no rebound and no guarding.  Musculoskeletal: Normal range of motion.  Lymphadenopathy:    She has no cervical adenopathy.  Neurological: She is alert and oriented to person, place, and time.  Skin: Skin is warm and dry. No rash noted.  Psychiatric: She has a normal mood and affect. Her behavior is normal.          Assessment & Plan:   Persistent nausea. Recent viral gastroenteritis.  Largely  resolved  We'll check a pregnancy test and treat symptomatically

## 2015-02-08 LAB — US OB LIMITED

## 2015-02-17 ENCOUNTER — Encounter: Payer: Self-pay | Admitting: *Deleted

## 2015-02-20 LAB — OB RESULTS CONSOLE HEPATITIS B SURFACE ANTIGEN: Hepatitis B Surface Ag: NEGATIVE

## 2015-02-20 LAB — OB RESULTS CONSOLE ABO/RH: RH Type: NEGATIVE

## 2015-02-20 LAB — OB RESULTS CONSOLE PLATELET COUNT: PLATELETS: 238 10*3/uL

## 2015-02-20 LAB — OB RESULTS CONSOLE GC/CHLAMYDIA
CHLAMYDIA, DNA PROBE: NEGATIVE
GC PROBE AMP, GENITAL: NEGATIVE

## 2015-02-20 LAB — OB RESULTS CONSOLE HIV ANTIBODY (ROUTINE TESTING): HIV: NONREACTIVE

## 2015-02-20 LAB — OB RESULTS CONSOLE RPR: RPR: NONREACTIVE

## 2015-02-20 LAB — OB RESULTS CONSOLE HGB/HCT, BLOOD: HEMOGLOBIN: 12.1 g/dL

## 2015-02-20 LAB — OB RESULTS CONSOLE RUBELLA ANTIBODY, IGM: Rubella: IMMUNE

## 2015-04-07 ENCOUNTER — Emergency Department
Admit: 2015-04-07 | Disposition: A | Discharge status: Home / Self Care | Payer: Self-pay | Admitting: Emergency Medicine

## 2015-04-07 LAB — CBC WITH DIFFERENTIAL/PLATELET
Basophil #: 0 10*3/uL (ref 0.0–0.1)
Basophil %: 0.2 %
EOS ABS: 0.1 10*3/uL (ref 0.0–0.7)
Eosinophil %: 1.6 %
HCT: 32 % — ABNORMAL LOW (ref 35.0–47.0)
HGB: 10.8 g/dL — ABNORMAL LOW (ref 12.0–16.0)
Lymphocyte #: 1.5 10*3/uL (ref 1.0–3.6)
Lymphocyte %: 17.4 %
MCH: 30.7 pg (ref 26.0–34.0)
MCHC: 33.7 g/dL (ref 32.0–36.0)
MCV: 91 fL (ref 80–100)
MONO ABS: 0.6 x10 3/mm (ref 0.2–0.9)
Monocyte %: 7 %
NEUTROS PCT: 73.8 %
Neutrophil #: 6.3 10*3/uL (ref 1.4–6.5)
PLATELETS: 205 10*3/uL (ref 150–440)
RBC: 3.52 10*6/uL — ABNORMAL LOW (ref 3.80–5.20)
RDW: 13.6 % (ref 11.5–14.5)
WBC: 8.5 10*3/uL (ref 3.6–11.0)

## 2015-04-07 LAB — PRO B NATRIURETIC PEPTIDE: B-TYPE NATIURETIC PEPTID: 28 pg/mL

## 2015-04-07 LAB — COMPREHENSIVE METABOLIC PANEL
ALT: 25 U/L
Albumin: 3.4 g/dL — ABNORMAL LOW
Alkaline Phosphatase: 41 U/L
Anion Gap: 7 (ref 7–16)
BUN: 9 mg/dL
Bilirubin,Total: 0.2 mg/dL — ABNORMAL LOW
CALCIUM: 8.7 mg/dL — AB
CO2: 25 mmol/L
CREATININE: 0.51 mg/dL
Chloride: 106 mmol/L
EGFR (African American): >60
Glucose: 117 mg/dL — ABNORMAL HIGH
Potassium: 3.6 mmol/L
SGOT(AST): 22 U/L
Sodium: 138 mmol/L
Total Protein: 6.1 g/dL — ABNORMAL LOW

## 2015-04-28 ENCOUNTER — Encounter (HOSPITAL_COMMUNITY): Payer: Self-pay | Admitting: *Deleted

## 2015-04-28 ENCOUNTER — Inpatient Hospital Stay (HOSPITAL_COMMUNITY)
Admission: AD | Admit: 2015-04-28 | Discharge: 2015-04-29 | Discharge status: Against Medical Advice | Payer: Medicaid Other | Source: Ambulatory Visit | Attending: Family Medicine | Admitting: Family Medicine

## 2015-04-28 DIAGNOSIS — O9989 Other specified diseases and conditions complicating pregnancy, childbirth and the puerperium: Secondary | ICD-10-CM | POA: Insufficient documentation

## 2015-04-28 DIAGNOSIS — R109 Unspecified abdominal pain: Secondary | ICD-10-CM | POA: Diagnosis not present

## 2015-04-28 DIAGNOSIS — Z3A20 20 weeks gestation of pregnancy: Secondary | ICD-10-CM | POA: Diagnosis not present

## 2015-04-28 DIAGNOSIS — M545 Low back pain: Secondary | ICD-10-CM | POA: Insufficient documentation

## 2015-04-28 DIAGNOSIS — R51 Headache: Secondary | ICD-10-CM | POA: Insufficient documentation

## 2015-04-28 LAB — URINALYSIS, ROUTINE W REFLEX MICROSCOPIC
BILIRUBIN URINE: NEGATIVE
Glucose, UA: 100 mg/dL — AB
Hgb urine dipstick: NEGATIVE
Ketones, ur: NEGATIVE mg/dL
NITRITE: NEGATIVE
PH: 6 (ref 5.0–8.0)
Protein, ur: NEGATIVE mg/dL
SPECIFIC GRAVITY, URINE: 1.01 (ref 1.005–1.030)
Urobilinogen, UA: 0.2 mg/dL (ref 0.0–1.0)

## 2015-04-28 LAB — URINE MICROSCOPIC-ADD ON

## 2015-04-28 NOTE — MAU Note (Signed)
Had moved some furniture today and started having some abd and back pain. Have been having some pain for past few wks but thought maybe moving furniture made it worse. Have bad headache for 4 days. Have not taken anything. Last saw Dr Rana SnareLowe 3/28. Lost insurance and has medicaid but [facility] does not take medicaid. Called [facility] and told they do not take transfers.

## 2015-04-29 NOTE — MAU Note (Signed)
Pt called and not in lobby 

## 2015-05-31 ENCOUNTER — Ambulatory Visit (INDEPENDENT_AMBULATORY_CARE_PROVIDER_SITE_OTHER): Payer: Medicaid Other | Admitting: Advanced Practice Midwife

## 2015-05-31 ENCOUNTER — Encounter: Payer: Self-pay | Admitting: Advanced Practice Midwife

## 2015-05-31 VITALS — BP 107/65 | HR 96 | Wt 169.0 lb

## 2015-05-31 DIAGNOSIS — Z3482 Encounter for supervision of other normal pregnancy, second trimester: Secondary | ICD-10-CM | POA: Diagnosis not present

## 2015-05-31 DIAGNOSIS — Z6791 Unspecified blood type, Rh negative: Secondary | ICD-10-CM | POA: Insufficient documentation

## 2015-05-31 DIAGNOSIS — O26892 Other specified pregnancy related conditions, second trimester: Secondary | ICD-10-CM

## 2015-05-31 DIAGNOSIS — O360121 Maternal care for anti-D [Rh] antibodies, second trimester, fetus 1: Secondary | ICD-10-CM | POA: Diagnosis not present

## 2015-05-31 DIAGNOSIS — Z3492 Encounter for supervision of normal pregnancy, unspecified, second trimester: Secondary | ICD-10-CM

## 2015-05-31 HISTORY — DX: Other specified pregnancy related conditions, second trimester: O26.892

## 2015-05-31 HISTORY — DX: Unspecified blood type, rh negative: Z67.91

## 2015-05-31 NOTE — Progress Notes (Signed)
   Subjective:  Leah Perez is a 23 y.o. G1P0 at [redacted]w[redacted]d being seen today for NOB transfer from [facility].  Patient reports no cramping and no leaking.  Contractions: Not present.  Vag. Bleeding: None. Movement: Present. Denies leaking of fluid.   The following portions of the patient's history were reviewed and updated as appropriate: allergies, current medications, past family history, past medical history, past social history, past surgical history and problem list.   Prenatal panel and First trimester screen done at previous practice. Records reviewed. Has not had anatomy scan.   Objective:   Filed Vitals:   05/31/15 1430  BP: 107/65  Pulse: 96  Weight: 169 lb (76.658 kg)    Fetal Status: Fetal Heart Rate (bpm): 142   Movement: Present     General:  Alert, oriented and cooperative. Patient is in no acute distress.  Skin: Skin is warm and dry. No rash noted.   Cardiovascular: Normal heart rate noted  Respiratory: Effort and breath sounds normal, no problems with respiration noted  Abdomen: Soft, gravid, appropriate for gestational age. Pain/Pressure: Absent     Vaginal: Vag. Bleeding: None.    Vag D/C Character: Thin  Cervix: Not evaluated        Extremities: Normal range of motion.  Edema: Trace  Mental Status: Normal mood and affect. Normal behavior. Normal judgment and thought content.   Urinalysis: Urine Protein: Negative Urine Glucose: Negative  Assessment and Plan:  Pregnancy: G1P0 at [redacted]w[redacted]d  There are no diagnoses linked to this encounter.  Preterm labor symptoms and general obstetric precautions including but not limited to vaginal bleeding, contractions, leaking of fluid and fetal movement were reviewed in detail with the patient.  Please refer to After Visit Summary for other counseling recommendations.   No Follow-up on file.   Dorathy Kinsman, CNM

## 2015-05-31 NOTE — Patient Instructions (Addendum)
[facility]  Second Trimester of Pregnancy The second trimester is from week 13 through week 28, months 4 through 6. The second trimester is often a time when you feel your best. Your body has also adjusted to being pregnant, and you begin to feel better physically. Usually, morning sickness has lessened or quit completely, you may have more energy, and you may have an increase in appetite. The second trimester is also a time when the fetus is growing rapidly. At the end of the sixth month, the fetus is about 9 inches long and weighs about 1 pounds. You will likely begin to feel the baby move (quickening) between 18 and 20 weeks of the pregnancy. BODY CHANGES Your body goes through many changes during pregnancy. The changes vary from woman to woman.   Your weight will continue to increase. You will notice your lower abdomen bulging out.  You may begin to get stretch marks on your hips, abdomen, and breasts.  You may develop headaches that can be relieved by medicines approved by your health care provider.  You may urinate more often because the fetus is pressing on your bladder.  You may develop or continue to have heartburn as a result of your pregnancy.  You may develop constipation because certain hormones are causing the muscles that push waste through your intestines to slow down.  You may develop hemorrhoids or swollen, bulging veins (varicose veins).  You may have back pain because of the weight gain and pregnancy hormones relaxing your joints between the bones in your pelvis and as a result of a shift in weight and the muscles that support your balance.  Your breasts will continue to grow and be tender.  Your gums may bleed and may be sensitive to brushing and flossing.  Dark spots or blotches (chloasma, mask of pregnancy) may develop on your face. This will likely fade after the baby is born.  A dark line from your belly button to the pubic area (linea nigra) may  appear. This will likely fade after the baby is born.  You may have changes in your hair. These can include thickening of your hair, rapid growth, and changes in texture. Some women also have hair loss during or after pregnancy, or hair that feels dry or thin. Your hair will most likely return to normal after your baby is born. WHAT TO EXPECT AT YOUR PRENATAL VISITS During a routine prenatal visit:  You will be weighed to make sure you and the fetus are growing normally.  Your blood pressure will be taken.  Your abdomen will be measured to track your baby's growth.  The fetal heartbeat will be listened to.  Any test results from the previous visit will be discussed. Your health care provider may ask you:  How you are feeling.  If you are feeling the baby move.  If you have had any abnormal symptoms, such as leaking fluid, bleeding, severe headaches, or abdominal cramping.  If you have any questions. Other tests that may be performed during your second trimester include:  Blood tests that check for:  Low iron levels (anemia).  Gestational diabetes (between 24 and 28 weeks).  Rh antibodies.  Urine tests to check for infections, diabetes, or protein in the urine.  An ultrasound to confirm the proper growth and development of the baby.  An amniocentesis to check for possible genetic problems.  Fetal screens for spina bifida and Down syndrome. HOME CARE INSTRUCTIONS   Avoid all smoking,  herbs, alcohol, and unprescribed drugs. These chemicals affect the formation and growth of the baby.  Follow your health care provider's instructions regarding medicine use. There are medicines that are either safe or unsafe to take during pregnancy.  Exercise only as directed by your health care provider. Experiencing uterine cramps is a good sign to stop exercising.  Continue to eat regular, healthy meals.  Wear a good support bra for breast tenderness.  Do not use hot tubs, steam  rooms, or saunas.  Wear your seat belt at all times when driving.  Avoid raw meat, uncooked cheese, cat litter boxes, and soil used by cats. These carry germs that can cause birth defects in the baby.  Take your prenatal vitamins.  Try taking a stool softener (if your health care provider approves) if you develop constipation. Eat more high-fiber foods, such as fresh vegetables or fruit and whole grains. Drink plenty of fluids to keep your urine clear or pale yellow.  Take warm sitz baths to soothe any pain or discomfort caused by hemorrhoids. Use hemorrhoid cream if your health care provider approves.  If you develop varicose veins, wear support hose. Elevate your feet for 15 minutes, 3-4 times a day. Limit salt in your diet.  Avoid heavy lifting, wear low heel shoes, and practice good posture.  Rest with your legs elevated if you have leg cramps or low back pain.  Visit your dentist if you have not gone yet during your pregnancy. Use a soft toothbrush to brush your teeth and be gentle when you floss.  A sexual relationship may be continued unless your health care provider directs you otherwise.  Continue to go to all your prenatal visits as directed by your health care provider. SEEK MEDICAL CARE IF:   You have dizziness.  You have mild pelvic cramps, pelvic pressure, or nagging pain in the abdominal area.  You have persistent nausea, vomiting, or diarrhea.  You have a bad smelling vaginal discharge.  You have pain with urination. SEEK IMMEDIATE MEDICAL CARE IF:   You have a fever.  You are leaking fluid from your vagina.  You have spotting or bleeding from your vagina.  You have severe abdominal cramping or pain.  You have rapid weight gain or loss.  You have shortness of breath with chest pain.  You notice sudden or extreme swelling of your face, hands, ankles, feet, or legs.  You have not felt your baby move in over an hour.  You have severe headaches that do  not go away with medicine.  You have vision changes. Document Released: 11/19/2001 Document Revised: 11/30/2013 Document Reviewed: 01/26/2013 [facility] Patient Information 2015 [place], [place]. This information is not intended to replace advice given to you by your health care provider. Make sure you discuss any questions you have with your health care provider.  Tdap Vaccine (Tetanus, Diphtheria, Pertussis): What You Need to Know 1. Why get vaccinated? Tetanus, diphtheria and pertussis can be very serious diseases, even for adolescents and adults. Tdap vaccine can protect [place] from these diseases. TETANUS (Lockjaw) causes painful muscle tightening and stiffness, usually all over the body.  It can lead to tightening of muscles in the head and neck so you can't open your mouth, swallow, or sometimes even breathe. Tetanus kills about 1 out of 5 people who are infected. DIPHTHERIA can cause a thick coating to form in the back of the throat.  It can lead to breathing problems, paralysis, heart failure, and death. PERTUSSIS (Whooping Cough)  causes severe coughing spells, which can cause difficulty breathing, vomiting and disturbed sleep.  It can also lead to weight loss, incontinence, and rib fractures. Up to 2 in 100 adolescents and 5 in 100 adults with pertussis are hospitalized or have complications, which could include pneumonia or death. These diseases are caused by bacteria. Diphtheria and pertussis are spread from person to person through coughing or sneezing. Tetanus enters the body through cuts, scratches, or wounds. Before vaccines, the [place] saw as many as 200,000 cases a year of diphtheria and pertussis, and hundreds of cases of tetanus. Since vaccination began, tetanus and diphtheria have dropped by about 99% and pertussis by about 80%. 2. Tdap vaccine Tdap vaccine can protect adolescents and adults from tetanus, diphtheria, and pertussis. One dose of Tdap is routinely given at age  44 or 2. People who did not get Tdap at that age should get it as soon as possible. Tdap is especially important for health care professionals and anyone having close contact with a baby younger than 12 months. Pregnant women should get a dose of Tdap during every pregnancy, to protect the newborn from pertussis. Infants are most at risk for severe, life-threatening complications from pertussis. A similar vaccine, called Td, protects from tetanus and diphtheria, but not pertussis. A Td booster should be given every 10 years. Tdap may be given as one of these boosters if you have not already gotten a dose. Tdap may also be given after a severe cut or burn to prevent tetanus infection. Your doctor can give you more information. Tdap may safely be given at the same time as other vaccines. 3. Some people should not get this vaccine  If you ever had a life-threatening allergic reaction after a dose of any tetanus, diphtheria, or pertussis containing vaccine, OR if you have a severe allergy to any part of this vaccine, you should not get Tdap. Tell your doctor if you have any severe allergies.  If you had a coma, or long or multiple seizures within 7 days after a childhood dose of DTP or DTaP, you should not get Tdap, unless a cause other than the vaccine was found. You can still get Td.  Talk to your doctor if you:  have epilepsy or another nervous system problem,  had severe pain or swelling after any vaccine containing diphtheria, tetanus or pertussis,  ever had Guillain-Barr Syndrome (GBS),  aren't feeling well on the day the shot is scheduled. 4. Risks of a vaccine reaction With any medicine, including vaccines, there is a chance of side effects. These are usually mild and go away on their own, but serious reactions are also possible. Brief fainting spells can follow a vaccination, leading to injuries from falling. Sitting or lying down for about 15 minutes can help prevent these. Tell your  doctor if you feel dizzy or light-headed, or have vision changes or ringing in the ears. Mild problems following Tdap (Did not interfere with activities)  Pain where the shot was given (about 3 in 4 adolescents or 2 in 3 adults)  Redness or swelling where the shot was given (about 1 person in 5)  Mild fever of at least 100.71F (up to about 1 in 25 adolescents or 1 in 100 adults)  Headache (about 3 or 4 people in 10)  Tiredness (about 1 person in 3 or 4)  Nausea, vomiting, diarrhea, stomach ache (up to 1 in 4 adolescents or 1 in 10 adults)  Chills, body aches, sore joints,  rash, swollen glands (uncommon) Moderate problems following Tdap (Interfered with activities, but did not require medical attention)  Pain where the shot was given (about 1 in 5 adolescents or 1 in 100 adults)  Redness or swelling where the shot was given (up to about 1 in 16 adolescents or 1 in 25 adults)  Fever over 102F (about 1 in 100 adolescents or 1 in 250 adults)  Headache (about 3 in 20 adolescents or 1 in 10 adults)  Nausea, vomiting, diarrhea, stomach ache (up to 1 or 3 people in 100)  Swelling of the entire arm where the shot was given (up to about 3 in 100). Severe problems following Tdap (Unable to perform usual activities; required medical attention)  Swelling, severe pain, bleeding and redness in the arm where the shot was given (rare). A severe allergic reaction could occur after any vaccine (estimated less than 1 in a million doses). 5. What if there is a serious reaction? What should I look for?  Look for anything that concerns you, such as signs of a severe allergic reaction, very high fever, or behavior changes. Signs of a severe allergic reaction can include hives, swelling of the face and throat, difficulty breathing, a fast heartbeat, dizziness, and weakness. These would start a few minutes to a few hours after the vaccination. What should I do?  If you think it is a severe allergic  reaction or other emergency that can't wait, call 9-1-1 or get the person to the nearest hospital. Otherwise, call your doctor.  Afterward, the reaction should be reported to the "Vaccine Adverse Event Reporting System" (VAERS). Your doctor might file this report, or you can do it yourself through the VAERS web site at www.vaers.LAgents.no, or by calling 1-952-262-6894. VAERS is only for reporting reactions. They do not give medical advice.  6. The National Vaccine Injury Compensation Program The [employer] Vaccine Injury Compensation Program (VICP) is a federal program that was created to compensate people who may have been injured by certain vaccines. Persons who believe they may have been injured by a vaccine can learn about the program and about filing a claim by calling 1-(229) 206-0454 or visiting the VICP website at SpiritualWord.at. 7. How can I learn more?  Ask your doctor.  Call your local or state health department.  Contact the Centers for Disease Control and Prevention (CDC):  Call 419 729 6307 or visit CDC's website at PicCapture.uy. CDC Tdap Vaccine VIS (04/16/12) Document Released: 05/26/2012 Document Revised: 04/11/2014 Document Reviewed: 03/09/2014 [employer] Patient Information 2015 [place], [place]. This information is not intended to replace advice given to you by your health care provider. Make sure you discuss any questions you have with your health care provider.

## 2015-06-05 ENCOUNTER — Ambulatory Visit (HOSPITAL_COMMUNITY)
Admission: RE | Admit: 2015-06-05 | Discharge: 2015-06-05 | Disposition: A | Discharge status: Home / Self Care | Payer: Medicaid Other | Source: Ambulatory Visit | Attending: Advanced Practice Midwife | Admitting: Advanced Practice Midwife

## 2015-06-05 DIAGNOSIS — Z3A25 25 weeks gestation of pregnancy: Secondary | ICD-10-CM | POA: Insufficient documentation

## 2015-06-05 DIAGNOSIS — Z3492 Encounter for supervision of normal pregnancy, unspecified, second trimester: Secondary | ICD-10-CM

## 2015-06-05 DIAGNOSIS — Z3689 Encounter for other specified antenatal screening: Secondary | ICD-10-CM | POA: Insufficient documentation

## 2015-06-05 DIAGNOSIS — Z36 Encounter for antenatal screening of mother: Secondary | ICD-10-CM | POA: Insufficient documentation

## 2015-06-05 DIAGNOSIS — O0932 Supervision of pregnancy with insufficient antenatal care, second trimester: Secondary | ICD-10-CM | POA: Insufficient documentation

## 2015-06-27 ENCOUNTER — Ambulatory Visit (INDEPENDENT_AMBULATORY_CARE_PROVIDER_SITE_OTHER): Payer: Medicaid Other | Admitting: Obstetrics & Gynecology

## 2015-06-27 VITALS — BP 132/78 | HR 114 | Wt 171.0 lb

## 2015-06-27 DIAGNOSIS — O360121 Maternal care for anti-D [Rh] antibodies, second trimester, fetus 1: Secondary | ICD-10-CM | POA: Diagnosis not present

## 2015-06-27 DIAGNOSIS — Z36 Encounter for antenatal screening of mother: Secondary | ICD-10-CM

## 2015-06-27 DIAGNOSIS — Z23 Encounter for immunization: Secondary | ICD-10-CM

## 2015-06-27 DIAGNOSIS — Z3482 Encounter for supervision of other normal pregnancy, second trimester: Secondary | ICD-10-CM

## 2015-06-27 DIAGNOSIS — Z3492 Encounter for supervision of normal pregnancy, unspecified, second trimester: Secondary | ICD-10-CM

## 2015-06-27 LAB — CBC
HCT: 33 % — ABNORMAL LOW (ref 36.0–46.0)
Hemoglobin: 10.8 g/dL — ABNORMAL LOW (ref 12.0–15.0)
MCH: 30.5 pg (ref 26.0–34.0)
MCHC: 32.7 g/dL (ref 30.0–36.0)
MCV: 93.2 fL (ref 78.0–100.0)
MPV: 10.4 fL (ref 8.6–12.4)
Platelets: 217 10*3/uL (ref 150–400)
RBC: 3.54 MIL/uL — AB (ref 3.87–5.11)
RDW: 12.9 % (ref 11.5–15.5)
WBC: 8.3 10*3/uL (ref 4.0–10.5)

## 2015-06-27 MED ORDER — RHO D IMMUNE GLOBULIN 1500 UNIT/2ML IJ SOSY
300.0000 ug | PREFILLED_SYRINGE | Freq: Once | INTRAMUSCULAR | Status: AC
  Administered 2015-06-27: 300 ug via INTRAMUSCULAR

## 2015-06-27 MED ORDER — OMEPRAZOLE 20 MG PO CPDR: 20.0000 mg | DELAYED_RELEASE_CAPSULE | Freq: Every day | ORAL | Status: DC

## 2015-06-27 NOTE — Addendum Note (Signed)
Addended by: Arne ClevelandHUTCHINSON, Natashia Roseman J on: 06/27/2015 12:15 PM   Modules accepted: Orders

## 2015-06-27 NOTE — Progress Notes (Signed)
Subjective:  Leah Perez is a 24 y.o. G1P0 at 3648w1d being seen today for ongoing prenatal care.  Patient reports no complaints.  Contractions: Not present.  Vag. Bleeding: None. Movement: Present. Denies leaking of fluid.   The following portions of the patient's history were reviewed and updated as appropriate: allergies, current medications, past family history, past medical history, past social history, past surgical history and problem list.   Objective:   Filed Vitals:   06/27/15 1007  BP: 132/78  Pulse: 114  Weight: 171 lb (77.565 kg)    Fetal Status: Fetal Heart Rate (bpm): 139   Movement: Present     General:  Alert, oriented and cooperative. Patient is in no acute distress.  Skin: Skin is warm and dry. No rash noted.   Cardiovascular: Normal heart rate noted  Respiratory: Normal respiratory effort, no problems with respiration noted  Abdomen: Soft, gravid, appropriate for gestational age. Pain/Pressure: Absent     Vaginal: Vag. Bleeding: None.       Cervix: Not evaluated        Extremities: Normal range of motion.  Edema: Trace  Mental Status: Normal mood and affect. Normal behavior. Normal judgment and thought content.   Urinalysis:      Assessment and Plan:  Pregnancy: G1P0 at 5748w1d  1. Rh negative status during pregnancy in second trimester, antepartum, fetus 1 - rho (d) immune globulin (RHIG/RHOPHYLAC) injection 300 mcg; Inject 2 mLs (300 mcg total) into the muscle once. - Antibody screen  2. Supervision of normal pregnancy in second trimester  - HIV antibody - RPR - CBC - Glucose Tolerance, 1 HR (50g) - Tdap vaccine greater than or equal to 7yo IM -Rhophylac  Preterm labor symptoms and general obstetric precautions including but not limited to vaginal bleeding, contractions, leaking of fluid and fetal movement were reviewed in detail with the patient. Please refer to After Visit Summary for other counseling recommendations.  No Follow-up on  file.   Allie BossierMyra C Castulo Scarpelli, MD

## 2015-06-27 NOTE — Progress Notes (Signed)
Having lots of acid reflux.  Doing 1hr GTT today, draw lab at 11:10am.

## 2015-06-28 LAB — RPR

## 2015-06-28 LAB — HIV ANTIBODY (ROUTINE TESTING W REFLEX): HIV: NONREACTIVE

## 2015-06-28 LAB — GLUCOSE TOLERANCE, 1 HOUR (50G) W/O FASTING: GLUCOSE 1 HOUR GTT: 125 mg/dL (ref 70–140)

## 2015-06-28 LAB — ANTIBODY SCREEN: Antibody Screen: NEGATIVE

## 2015-07-06 ENCOUNTER — Ambulatory Visit (HOSPITAL_COMMUNITY)
Admission: RE | Admit: 2015-07-06 | Discharge: 2015-07-06 | Disposition: A | Discharge status: Home / Self Care | Payer: Medicaid Other | Source: Ambulatory Visit | Attending: Obstetrics & Gynecology | Admitting: Obstetrics & Gynecology

## 2015-07-06 DIAGNOSIS — IMO0002 Reserved for concepts with insufficient information to code with codable children: Secondary | ICD-10-CM | POA: Insufficient documentation

## 2015-07-06 DIAGNOSIS — Z3482 Encounter for supervision of other normal pregnancy, second trimester: Secondary | ICD-10-CM | POA: Diagnosis present

## 2015-07-06 DIAGNOSIS — Z3A29 29 weeks gestation of pregnancy: Secondary | ICD-10-CM | POA: Insufficient documentation

## 2015-07-06 DIAGNOSIS — Z0489 Encounter for examination and observation for other specified reasons: Secondary | ICD-10-CM | POA: Insufficient documentation

## 2015-07-06 DIAGNOSIS — Z3492 Encounter for supervision of normal pregnancy, unspecified, second trimester: Secondary | ICD-10-CM

## 2015-07-12 ENCOUNTER — Encounter: Payer: Self-pay | Admitting: Obstetrics & Gynecology

## 2015-07-20 ENCOUNTER — Ambulatory Visit (INDEPENDENT_AMBULATORY_CARE_PROVIDER_SITE_OTHER): Payer: Medicaid Other | Admitting: Obstetrics & Gynecology

## 2015-07-20 VITALS — BP 111/76 | HR 91 | Wt 170.0 lb

## 2015-07-20 DIAGNOSIS — Z3492 Encounter for supervision of normal pregnancy, unspecified, second trimester: Secondary | ICD-10-CM

## 2015-07-20 DIAGNOSIS — Z3482 Encounter for supervision of other normal pregnancy, second trimester: Secondary | ICD-10-CM

## 2015-07-20 NOTE — Progress Notes (Signed)
Subjective:  Leah Perez is a 24 y.o. G1P0 at [redacted]w[redacted]d being seen today for ongoing prenatal care.  Patient reports no complaints.  Contractions: Not present.  Vag. Bleeding: None. Movement: Present. Denies leaking of fluid.   The following portions of the patient's history were reviewed and updated as appropriate: allergies, current medications, past family history, past medical history, past social history, past surgical history and problem list.   Objective:   Filed Vitals:   07/20/15 1044  BP: 111/76  Pulse: 91  Weight: 170 lb (77.111 kg)    Fetal Status: Fetal Heart Rate (bpm): 139   Movement: Present     General:  Alert, oriented and cooperative. Patient is in no acute distress.  Skin: Skin is warm and dry. No rash noted.   Cardiovascular: Normal heart rate noted  Respiratory: Normal respiratory effort, no problems with respiration noted  Abdomen: Soft, gravid, appropriate for gestational age. Pain/Pressure: Absent     Pelvic: Vag. Bleeding: None Vag D/C Character: Thin   Cervical exam deferred        Extremities: Normal range of motion.  Edema: Mild pitting, slight indentation  Mental Status: Normal mood and affect. Normal behavior. Normal judgment and thought content.   Urinalysis: Urine Protein: Trace Urine Glucose: Negative  Assessment and Plan:  Pregnancy: G1P0 at [redacted]w[redacted]d  1. Supervision of normal pregnancy in second trimester   Preterm labor symptoms and general obstetric precautions including but not limited to vaginal bleeding, contractions, leaking of fluid and fetal movement were reviewed in detail with the patient.  Please refer to After Visit Summary for other counseling recommendations.  Return in about 3 weeks (around 08/10/2015).   Allie Bossier, MD

## 2015-07-21 ENCOUNTER — Encounter: Payer: Medicaid Other | Admitting: Obstetrics & Gynecology

## 2015-08-10 ENCOUNTER — Encounter: Payer: Self-pay | Admitting: Obstetrics and Gynecology

## 2015-08-10 ENCOUNTER — Ambulatory Visit (INDEPENDENT_AMBULATORY_CARE_PROVIDER_SITE_OTHER): Payer: Medicaid Other | Admitting: Obstetrics and Gynecology

## 2015-08-10 VITALS — BP 119/74 | HR 102 | Wt 173.0 lb

## 2015-08-10 DIAGNOSIS — O360121 Maternal care for anti-D [Rh] antibodies, second trimester, fetus 1: Secondary | ICD-10-CM

## 2015-08-10 DIAGNOSIS — Z3482 Encounter for supervision of other normal pregnancy, second trimester: Secondary | ICD-10-CM

## 2015-08-10 DIAGNOSIS — Z3492 Encounter for supervision of normal pregnancy, unspecified, second trimester: Secondary | ICD-10-CM

## 2015-08-10 DIAGNOSIS — Z23 Encounter for immunization: Secondary | ICD-10-CM

## 2015-08-10 NOTE — Progress Notes (Signed)
Subjective:  Leah Perez is a 24 y.o. G1P0 at [redacted]w[redacted]d being seen today for ongoing prenatal care.  Patient reports no complaints.  Contractions: Not present.  Vag. Bleeding: None. Movement: Present. Denies leaking of fluid.   The following portions of the patient's history were reviewed and updated as appropriate: allergies, current medications, past family history, past medical history, past social history, past surgical history and problem list.   Objective:   Filed Vitals:   08/10/15 1026  BP: 119/74  Pulse: 102  Weight: 173 lb (78.472 kg)    Fetal Status: Fetal Heart Rate (bpm): 156   Movement: Present     General:  Alert, oriented and cooperative. Patient is in no acute distress.  Skin: Skin is warm and dry. No rash noted.   Cardiovascular: Normal heart rate noted  Respiratory: Normal respiratory effort, no problems with respiration noted  Abdomen: Soft, gravid, appropriate for gestational age. Pain/Pressure: Absent     Pelvic: Vag. Bleeding: None Vag D/C Character: Thin   Cervical exam deferred        Extremities: Normal range of motion.  Edema: Mild pitting, slight indentation  Mental Status: Normal mood and affect. Normal behavior. Normal judgment and thought content.   Urinalysis:      Assessment and Plan:  Pregnancy: G1P0 at [redacted]w[redacted]d  1. Supervision of normal pregnancy in second trimester Patient is doing well without complaints Cultures next visit Flu shot today - Flu Vaccine QUAD 36+ mos IM (Fluarix, Quad PF)  2. Rh negative status during pregnancy in second trimester, antepartum, fetus 1   Preterm labor symptoms and general obstetric precautions including but not limited to vaginal bleeding, contractions, leaking of fluid and fetal movement were reviewed in detail with the patient. Please refer to After Visit Summary for other counseling recommendations.  Return in about 2 weeks (around 08/24/2015).   Catalina Antigua, MD

## 2015-08-24 ENCOUNTER — Ambulatory Visit (INDEPENDENT_AMBULATORY_CARE_PROVIDER_SITE_OTHER): Payer: Medicaid Other | Admitting: Obstetrics & Gynecology

## 2015-08-24 VITALS — BP 117/73 | HR 89 | Wt 177.0 lb

## 2015-08-24 DIAGNOSIS — Z3482 Encounter for supervision of other normal pregnancy, second trimester: Secondary | ICD-10-CM

## 2015-08-24 DIAGNOSIS — Z36 Encounter for antenatal screening of mother: Secondary | ICD-10-CM | POA: Diagnosis not present

## 2015-08-24 DIAGNOSIS — Z3492 Encounter for supervision of normal pregnancy, unspecified, second trimester: Secondary | ICD-10-CM

## 2015-08-24 LAB — OB RESULTS CONSOLE GBS: STREP GROUP B AG: NEGATIVE

## 2015-08-24 NOTE — Progress Notes (Signed)
Subjective:  Leah Perez is a 24 y.o. G1P0 at [redacted]w[redacted]d being seen today for ongoing prenatal care.  Patient reports no complaints.  Contractions: Irregular.  Vag. Bleeding: None. Movement: Present. Denies leaking of fluid.   The following portions of the patient's history were reviewed and updated as appropriate: allergies, current medications, past family history, past medical history, past social history, past surgical history and problem list.   Objective:   Filed Vitals:   08/24/15 1030  BP: 117/73  Pulse: 89  Weight: 177 lb (80.287 kg)    Fetal Status: Fetal Heart Rate (bpm): 136   Movement: Present     General:  Alert, oriented and cooperative. Patient is in no acute distress.  Skin: Skin is warm and dry. No rash noted.   Cardiovascular: Normal heart rate noted  Respiratory: Normal respiratory effort, no problems with respiration noted  Abdomen: Soft, gravid, appropriate for gestational age. Pain/Pressure: Present     Pelvic: Vag. Bleeding: None Vag D/C Character: Thin   Cervical exam deferred        Extremities: Normal range of motion.  Edema: Mild pitting, slight indentation  Mental Status: Normal mood and affect. Normal behavior. Normal judgment and thought content.   Urinalysis:      Assessment and Plan:  Pregnancy: G1P0 at [redacted]w[redacted]d  1. Supervision of normal pregnancy in second trimester  - Culture, beta strep (group b only) - GC/Chlamydia Probe Amp  Preterm labor symptoms and general obstetric precautions including but not limited to vaginal bleeding, contractions, leaking of fluid and fetal movement were reviewed in detail with the patient. Please refer to After Visit Summary for other counseling recommendations.  No Follow-up on file.   Allie Bossier, MD

## 2015-08-25 LAB — GC/CHLAMYDIA PROBE AMP
CT Probe RNA: NEGATIVE
GC PROBE AMP APTIMA: NEGATIVE

## 2015-08-26 LAB — CULTURE, BETA STREP (GROUP B ONLY)

## 2015-08-30 ENCOUNTER — Encounter: Payer: Medicaid Other | Admitting: Obstetrics and Gynecology

## 2015-09-07 ENCOUNTER — Encounter: Payer: Medicaid Other | Admitting: Family Medicine

## 2015-09-09 ENCOUNTER — Encounter (HOSPITAL_COMMUNITY): Payer: Self-pay

## 2015-09-09 ENCOUNTER — Inpatient Hospital Stay (HOSPITAL_COMMUNITY)
Admission: AD | Admit: 2015-09-09 | Discharge: 2015-09-12 | DRG: 775 | Disposition: A | Discharge status: Home / Self Care | Payer: Medicaid Other | Source: Ambulatory Visit | Attending: Obstetrics & Gynecology | Admitting: Obstetrics & Gynecology

## 2015-09-09 DIAGNOSIS — O26893 Other specified pregnancy related conditions, third trimester: Secondary | ICD-10-CM | POA: Diagnosis present

## 2015-09-09 DIAGNOSIS — Z833 Family history of diabetes mellitus: Secondary | ICD-10-CM

## 2015-09-09 DIAGNOSIS — Z3A38 38 weeks gestation of pregnancy: Secondary | ICD-10-CM

## 2015-09-09 DIAGNOSIS — O4290 Premature rupture of membranes, unspecified as to length of time between rupture and onset of labor, unspecified weeks of gestation: Secondary | ICD-10-CM

## 2015-09-09 DIAGNOSIS — F17210 Nicotine dependence, cigarettes, uncomplicated: Secondary | ICD-10-CM | POA: Diagnosis present

## 2015-09-09 DIAGNOSIS — O4292 Full-term premature rupture of membranes, unspecified as to length of time between rupture and onset of labor: Secondary | ICD-10-CM | POA: Diagnosis present

## 2015-09-09 DIAGNOSIS — F411 Generalized anxiety disorder: Secondary | ICD-10-CM | POA: Diagnosis present

## 2015-09-09 DIAGNOSIS — Z6791 Unspecified blood type, Rh negative: Secondary | ICD-10-CM | POA: Diagnosis not present

## 2015-09-09 DIAGNOSIS — O99334 Smoking (tobacco) complicating childbirth: Secondary | ICD-10-CM | POA: Diagnosis present

## 2015-09-09 DIAGNOSIS — N9489 Other specified conditions associated with female genital organs and menstrual cycle: Secondary | ICD-10-CM | POA: Diagnosis present

## 2015-09-09 DIAGNOSIS — F909 Attention-deficit hyperactivity disorder, unspecified type: Secondary | ICD-10-CM | POA: Diagnosis present

## 2015-09-09 DIAGNOSIS — IMO0001 Reserved for inherently not codable concepts without codable children: Secondary | ICD-10-CM

## 2015-09-09 DIAGNOSIS — O26892 Other specified pregnancy related conditions, second trimester: Secondary | ICD-10-CM

## 2015-09-09 HISTORY — DX: Premature rupture of membranes, unspecified as to length of time between rupture and onset of labor, unspecified weeks of gestation: O42.90

## 2015-09-09 LAB — CBC
HEMATOCRIT: 34.2 % — AB (ref 36.0–46.0)
HEMOGLOBIN: 11.6 g/dL — AB (ref 12.0–15.0)
MCH: 30.1 pg (ref 26.0–34.0)
MCHC: 33.9 g/dL (ref 30.0–36.0)
MCV: 88.8 fL (ref 78.0–100.0)
Platelets: 218 10*3/uL (ref 150–400)
RBC: 3.85 MIL/uL — ABNORMAL LOW (ref 3.87–5.11)
RDW: 13.4 % (ref 11.5–15.5)
WBC: 10.3 10*3/uL (ref 4.0–10.5)

## 2015-09-09 MED ORDER — LACTATED RINGERS IV SOLN: INTRAVENOUS | Status: DC

## 2015-09-09 MED ORDER — CITRIC ACID-SODIUM CITRATE 334-500 MG/5ML PO SOLN: 30.0000 mL | ORAL | Status: DC | PRN

## 2015-09-09 MED ORDER — OXYCODONE-ACETAMINOPHEN 5-325 MG PO TABS: 1.0000 | ORAL_TABLET | ORAL | Status: DC | PRN

## 2015-09-09 MED ORDER — DIPHENHYDRAMINE HCL 50 MG/ML IJ SOLN: 12.5000 mg | INTRAMUSCULAR | Status: DC | PRN

## 2015-09-09 MED ORDER — OXYCODONE-ACETAMINOPHEN 5-325 MG PO TABS: 2.0000 | ORAL_TABLET | ORAL | Status: DC | PRN

## 2015-09-09 MED ORDER — MISOPROSTOL 200 MCG PO TABS
50.0000 ug | ORAL_TABLET | ORAL | Status: DC
  Administered 2015-09-09 (×3): 50 ug via ORAL
  Filled 2015-09-09 (×3): qty 0.5

## 2015-09-09 MED ORDER — EPHEDRINE 5 MG/ML INJ: 10.0000 mg | INTRAVENOUS | Status: DC | PRN

## 2015-09-09 MED ORDER — LACTATED RINGERS IV SOLN
500.0000 mL | INTRAVENOUS | Status: DC | PRN
  Administered 2015-09-10 (×2): 500 mL via INTRAVENOUS

## 2015-09-09 MED ORDER — LIDOCAINE HCL (PF) 1 % IJ SOLN
30.0000 mL | INTRAMUSCULAR | Status: AC | PRN
  Administered 2015-09-10: 30 mL via SUBCUTANEOUS
  Filled 2015-09-09: qty 30

## 2015-09-09 MED ORDER — ACETAMINOPHEN 325 MG PO TABS: 650.0000 mg | ORAL_TABLET | ORAL | Status: DC | PRN

## 2015-09-09 MED ORDER — EPHEDRINE 5 MG/ML INJ
10.0000 mg | INTRAVENOUS | Status: DC | PRN
Start: 2015-09-09 — End: 2015-09-10

## 2015-09-09 MED ORDER — OXYTOCIN BOLUS FROM INFUSION: 500.0000 mL | INTRAVENOUS | Status: DC

## 2015-09-09 MED ORDER — LACTATED RINGERS IV SOLN: 500.0000 mL | INTRAVENOUS | Status: DC | PRN

## 2015-09-09 MED ORDER — TERBUTALINE SULFATE 1 MG/ML IJ SOLN: 0.2500 mg | Freq: Once | INTRAMUSCULAR | Status: DC | PRN

## 2015-09-09 MED ORDER — OXYTOCIN BOLUS FROM INFUSION
500.0000 mL | INTRAVENOUS | Status: DC
  Administered 2015-09-10: 500 mL via INTRAVENOUS

## 2015-09-09 MED ORDER — ONDANSETRON HCL 4 MG/2ML IJ SOLN
4.0000 mg | Freq: Four times a day (QID) | INTRAMUSCULAR | Status: DC | PRN
  Administered 2015-09-10: 4 mg via INTRAVENOUS
  Filled 2015-09-09: qty 2

## 2015-09-09 MED ORDER — FENTANYL 2.5 MCG/ML BUPIVACAINE 1/10 % EPIDURAL INFUSION (WH - ANES)
14.0000 mL/h | INTRAMUSCULAR | Status: DC | PRN
  Filled 2015-09-09: qty 125

## 2015-09-09 MED ORDER — OXYTOCIN 40 UNITS IN LACTATED RINGERS INFUSION - SIMPLE MED
62.5000 mL/h | INTRAVENOUS | Status: DC
  Filled 2015-09-09: qty 1000

## 2015-09-09 MED ORDER — LACTATED RINGERS IV SOLN
INTRAVENOUS | Status: DC
  Administered 2015-09-09: 20:00:00 via INTRAVENOUS

## 2015-09-09 MED ORDER — PHENYLEPHRINE 40 MCG/ML (10ML) SYRINGE FOR IV PUSH (FOR BLOOD PRESSURE SUPPORT)
80.0000 ug | PREFILLED_SYRINGE | INTRAVENOUS | Status: DC | PRN
  Filled 2015-09-09: qty 20

## 2015-09-09 MED ORDER — FENTANYL CITRATE (PF) 100 MCG/2ML IJ SOLN
100.0000 ug | INTRAMUSCULAR | Status: DC | PRN
  Administered 2015-09-10: 100 ug via INTRAVENOUS
  Filled 2015-09-09: qty 2

## 2015-09-09 MED ORDER — PHENYLEPHRINE 40 MCG/ML (10ML) SYRINGE FOR IV PUSH (FOR BLOOD PRESSURE SUPPORT): 80.0000 ug | PREFILLED_SYRINGE | INTRAVENOUS | Status: DC | PRN

## 2015-09-09 MED ORDER — HYDROXYZINE HCL 50 MG PO TABS: 50.0000 mg | ORAL_TABLET | Freq: Four times a day (QID) | ORAL | Status: DC | PRN

## 2015-09-09 MED ORDER — ACETAMINOPHEN 325 MG PO TABS
650.0000 mg | ORAL_TABLET | ORAL | Status: DC | PRN
Start: 2015-09-09 — End: 2015-09-09

## 2015-09-09 MED ORDER — ONDANSETRON HCL 4 MG/2ML IJ SOLN: 4.0000 mg | Freq: Four times a day (QID) | INTRAMUSCULAR | Status: DC | PRN

## 2015-09-09 MED ORDER — LIDOCAINE HCL (PF) 1 % IJ SOLN: 30.0000 mL | INTRAMUSCULAR | Status: DC | PRN

## 2015-09-09 MED ORDER — FLEET ENEMA 7-19 GM/118ML RE ENEM: 1.0000 | ENEMA | RECTAL | Status: DC | PRN

## 2015-09-09 MED ORDER — OXYTOCIN 40 UNITS IN LACTATED RINGERS INFUSION - SIMPLE MED: 62.5000 mL/h | INTRAVENOUS | Status: DC

## 2015-09-09 MED ORDER — FENTANYL 2.5 MCG/ML BUPIVACAINE 1/10 % EPIDURAL INFUSION (WH - ANES): 14.0000 mL/h | INTRAMUSCULAR | Status: DC | PRN

## 2015-09-09 NOTE — MAU Note (Signed)
Positive fern slide obtained by Joellyn Haff CNM

## 2015-09-09 NOTE — H&P (Signed)
Leah Perez is a 24 y.o. G1P0 female at [redacted]w[redacted]d by 8wk u/s presenting w/ report of intermittent lof since Thursday am.   Reports active fetal movement, contractions: cramping, vaginal bleeding: none, membranes: undetermined. Initiated prenatal care at Dr. Raynaldo Opitz early in pregnancy, then tx to [facility] at 24.2 wks d/t insurance.  Most recent u/s 28wks d/t incomplete anatomy- anatomy normal, efw 34%. Pregnancy complicated by Rh neg, received rhophylac >28wls.  H/O depression/anxiety/adhd- no meds  Past Medical History: Past Medical History  Diagnosis Date  . ANXIETY 03/06/2009  . DEPRESSION 03/06/2009  . Migraine   . Depression     Past Surgical History: Past Surgical History  Procedure Laterality Date  . Wisdom tooth extraction      Obstetrical History: OB History    Gravida Para Term Preterm AB TAB SAB Ectopic Multiple Living   1               Social History: Social History   Social History  . Marital Status: Single    Spouse Name: N/A  . Number of Children: N/A  . Years of Education: N/A   Social History Main Topics  . Smoking status: Current Some Day Smoker    Types: Cigarettes    Last Attempt to Quit: 12/10/2011  . Smokeless tobacco: Never Used  . Alcohol Use: 0.6 oz/week    1 Glasses of wine per week  . Drug Use: Yes    Special: Marijuana  . Sexual Activity: Yes    Birth Control/ Protection: Implant   Other Topics Concern  . None   Social History Narrative   ** Merged History Encounter **        Family History: Family History  Problem Relation Age of Onset  . Diabetes Father   . Obesity Father   . Obesity Brother   . Alcohol abuse Brother     Allergies: No Known Allergies  Prescriptions prior to admission  Medication Sig Dispense Refill Last Dose  . loratadine (CLARITIN) 10 MG tablet Take 10 mg by mouth daily.    prn  . omeprazole (PRILOSEC) 20 MG capsule Take 1 capsule (20 mg total) by mouth daily. 30 capsule 6 Past Week at Unknown  time  . Prenatal Vit-Fe Fumarate-FA (MULTIVITAMIN-PRENATAL) 27-0.8 MG TABS tablet Take 1 tablet by mouth daily at 12 noon.   09/08/2015 at Unknown time     Review of Systems  Pertinent pos/neg as indicated in HPI    Blood pressure 119/66, pulse 106, temperature 98.3 F (36.8 C), resp. rate 18, weight 82.827 kg (182 lb 9.6 oz), last menstrual period 10/31/2014. General appearance: alert, cooperative and no distress Lungs: clear to auscultation bilaterally Heart: regular rate and rhythm Abdomen: gravid, soft, non-tender, feels SGA Ext genitalia: ~1cm pedunculated inclusion cyst at introitus- pt unaware but states she has stopped having sex b/c it was painful SSE: cx visually closed, +pooling clear fluid, +fern SVE: deferred Extremities: no edema DTR's 2+  Fetal monitoring: FHR: 150 bpm, variability: moderate,  Accelerations: Present,  decelerations:  Absent Uterine activity: irregular    Presentation: cephalic confirmed by informal bs u/s by myself and Dr. Despina Hidden   Prenatal labs: ABO, Rh: A/Negative/-- (03/14 0000) Antibody: NEG (07/19 1110) Rubella:   RPR: NON REAC (07/19 1110)  HBsAg: Negative (03/14 0000)  HIV: NONREACTIVE (07/19 1110)  GBS: Negative (09/15 0000)   1 hr Glucola: 125 Genetic screening:  1st trimester screen neg Anatomy US: normal female  No results found for this  or any previous visit (from the past 24 hour(s)).   Assessment:  [redacted]w[redacted]d SIUP  G1P0  Prolonged PROM x ~48hrs  ~1cm pedunculated inclusion cyst at introitus  Cat 1 FHR  GBS Negative (09/15 0000)  Plan:  Admit to BS  IV pain meds/epidural prn active labor  Cytotec po q 4hrs   Anticipate NSVB   Plans to breastfeed  Contraception: undecided  Marge Duncans CNM, WHNP-BC 09/09/2015, 1:49 PM

## 2015-09-09 NOTE — MAU Note (Signed)
Been having really bad abd pains,  Is constant but intensifies.  Has had "a lot of leakage" past 3 days, watery, some mucous. Soaking pads and underwear

## 2015-09-10 ENCOUNTER — Encounter (HOSPITAL_COMMUNITY): Payer: Self-pay | Admitting: General Practice

## 2015-09-10 DIAGNOSIS — Z3A38 38 weeks gestation of pregnancy: Secondary | ICD-10-CM

## 2015-09-10 DIAGNOSIS — N9489 Other specified conditions associated with female genital organs and menstrual cycle: Secondary | ICD-10-CM

## 2015-09-10 DIAGNOSIS — O4292 Full-term premature rupture of membranes, unspecified as to length of time between rupture and onset of labor: Secondary | ICD-10-CM

## 2015-09-10 LAB — HIV ANTIBODY (ROUTINE TESTING W REFLEX): HIV Screen 4th Generation wRfx: NONREACTIVE

## 2015-09-10 LAB — RPR: RPR Ser Ql: NONREACTIVE

## 2015-09-10 MED ORDER — SIMETHICONE 80 MG PO CHEW
80.0000 mg | CHEWABLE_TABLET | ORAL | Status: DC | PRN
Start: 2015-09-10 — End: 2015-09-12

## 2015-09-10 MED ORDER — LANOLIN HYDROUS EX OINT: TOPICAL_OINTMENT | CUTANEOUS | Status: DC | PRN

## 2015-09-10 MED ORDER — DIBUCAINE 1 % RE OINT
1.0000 "application " | TOPICAL_OINTMENT | RECTAL | Status: DC | PRN
  Administered 2015-09-11: 1 via RECTAL
  Filled 2015-09-10: qty 28

## 2015-09-10 MED ORDER — PRENATAL MULTIVITAMIN CH
1.0000 | ORAL_TABLET | Freq: Every day | ORAL | Status: DC
  Administered 2015-09-10 – 2015-09-12 (×3): 1 via ORAL
  Filled 2015-09-10 (×3): qty 1

## 2015-09-10 MED ORDER — ONDANSETRON HCL 4 MG/2ML IJ SOLN: 4.0000 mg | INTRAMUSCULAR | Status: DC | PRN

## 2015-09-10 MED ORDER — ONDANSETRON HCL 4 MG PO TABS: 4.0000 mg | ORAL_TABLET | ORAL | Status: DC | PRN

## 2015-09-10 MED ORDER — OXYTOCIN 40 UNITS IN LACTATED RINGERS INFUSION - SIMPLE MED
1.0000 m[IU]/min | INTRAVENOUS | Status: DC
  Administered 2015-09-10: 2 m[IU]/min via INTRAVENOUS

## 2015-09-10 MED ORDER — OXYTOCIN 40 UNITS IN LACTATED RINGERS INFUSION - SIMPLE MED: 62.5000 mL/h | INTRAVENOUS | Status: DC | PRN

## 2015-09-10 MED ORDER — BENZOCAINE-MENTHOL 20-0.5 % EX AERO
1.0000 "application " | INHALATION_SPRAY | CUTANEOUS | Status: DC | PRN
  Administered 2015-09-10: 1 via TOPICAL
  Filled 2015-09-10: qty 56

## 2015-09-10 MED ORDER — IBUPROFEN 600 MG PO TABS
600.0000 mg | ORAL_TABLET | Freq: Four times a day (QID) | ORAL | Status: DC
  Administered 2015-09-10 – 2015-09-12 (×9): 600 mg via ORAL
  Filled 2015-09-10 (×9): qty 1

## 2015-09-10 MED ORDER — ACETAMINOPHEN 325 MG PO TABS: 650.0000 mg | ORAL_TABLET | ORAL | Status: DC | PRN

## 2015-09-10 MED ORDER — TETANUS-DIPHTH-ACELL PERTUSSIS 5-2.5-18.5 LF-MCG/0.5 IM SUSP: 0.5000 mL | Freq: Once | INTRAMUSCULAR | Status: DC

## 2015-09-10 MED ORDER — DIPHENHYDRAMINE HCL 25 MG PO CAPS: 25.0000 mg | ORAL_CAPSULE | Freq: Four times a day (QID) | ORAL | Status: DC | PRN

## 2015-09-10 MED ORDER — WITCH HAZEL-GLYCERIN EX PADS
1.0000 "application " | MEDICATED_PAD | CUTANEOUS | Status: DC | PRN
  Administered 2015-09-10: 1 via TOPICAL

## 2015-09-10 MED ORDER — OXYCODONE-ACETAMINOPHEN 5-325 MG PO TABS
1.0000 | ORAL_TABLET | ORAL | Status: DC | PRN
  Administered 2015-09-10 – 2015-09-12 (×6): 1 via ORAL
  Filled 2015-09-10 (×6): qty 1

## 2015-09-10 MED ORDER — TERBUTALINE SULFATE 1 MG/ML IJ SOLN: 0.2500 mg | Freq: Once | INTRAMUSCULAR | Status: DC | PRN

## 2015-09-10 MED ORDER — SENNOSIDES-DOCUSATE SODIUM 8.6-50 MG PO TABS
2.0000 | ORAL_TABLET | ORAL | Status: DC
  Filled 2015-09-10: qty 2

## 2015-09-10 MED ORDER — ZOLPIDEM TARTRATE 5 MG PO TABS: 5.0000 mg | ORAL_TABLET | Freq: Every evening | ORAL | Status: DC | PRN

## 2015-09-10 NOTE — Lactation Note (Signed)
This note was copied from the chart of Leah Perez. Lactation Consultation Note  Patient Name: Leah Perez [id number]'[id number] Date: 09/10/2015 Reason for consult: Initial assessment   With this first time mom of a term baby, now 31 hours old. Mom has not been able to get baby to latch. Mom has small, short nipples and very edematous areola, making compression difficult. The baby is sleepy at this time. I fitted mom with a 16 nipple shield, with a good fit. The baby was latched , but no suckles, fell asleep. I then did hand expression with mom. It took a couple of minutes, but then her colostrum was flowing well. The baby spoon fed 4 ml's and tolerated well. I brought mom a foley cup and instructed her in it's use.  I also reviewed use of the hand pump with mom, and decreased her to size 21 flanges, with a good fit. Mom has a medela DEP at home. I was not able to make a seal with the hand pump, using both the 21 and 24 flange.  Mom is going to put on a bra and wear her inverted nipple shells, to try and decrease some of her edema. On exam, the baby has a posterior, short frenulum, causing his tongue to have limited movement, and cups to form a bowl shape on elevation. Using the nipple shield should help with breast feeding. Mom reports that she had to have her upper lip frenulum clipped in third grade. [name] also has a tight upper lip frenulum.  Mom has a Medela DEP at home. Mom's nurse, Shanda Bumps, will set up a DEP for mom to protect her milk supply and provide EBm for the baby.  Mom knows to call for questions/concerns.    Maternal Data Formula Feeding for Exclusion: No Has patient been taught Hand Expression?: Yes Does the patient have breastfeeding experience prior to this delivery?: No  Feeding Feeding Type: Breast Milk  LATCH Score/Interventions Latch: Repeated attempts needed to sustain latch, nipple held in mouth throughout feeding, stimulation needed to elicit sucking reflex. (latched  with 16 nipple shield) Intervention(s): Assist with latch;Breast compression  Audible Swallowing: None (baby latched, no suckles, asleep, then spoon fed colostrum) Intervention(s): Skin to skin;Hand expression  Type of Nipple: Flat (small nipples with very short shaft - nipple shiled used with good results) Intervention(s): Shells;Hand pump  Comfort (Breast/Nipple): Soft / non-tender (dematous - mom ot wear bra and shells)     Hold (Positioning): Assistance needed to correctly position infant at breast and maintain latch. Intervention(s): Breastfeeding basics reviewed;Support Pillows;Position options;Skin to skin  LATCH Score: 5  Lactation Tools Discussed/Used Tools: Nipple Shields Nipple shield size: 16   Consult Status Consult Status: Follow-up Date: 09/11/15 Follow-up type: In-patient    Alfred Levins 09/10/2015, 2:08 PM

## 2015-09-10 NOTE — Progress Notes (Signed)
Epidural removed. Tip intact.

## 2015-09-10 NOTE — Progress Notes (Signed)
Leah Perez is a 24 y.o. G1P0 at [redacted]w[redacted]d admitted for prolonged PROM  Subjective: Patient is a significant amount of discomfort. Is asking for something for this pain. Is asking about the current plan of care. Sister and FOB at bedside.  Objective: BP 136/82 mmHg  Pulse 95  Temp(Src) 98.8 F (37.1 C) (Oral)  Resp 18  Ht  (1.651 m)  Wt 83.008 kg (183 lb)  BMI 30.45 kg/m2  LMP 10/31/2014    FHT:  FHR: 140 bpm, variability: minimal ,  accelerations:  Present,  decelerations:  Absent UC:   irregular, every 3-5 minutes  SVE:   Dilation: 2 Effacement (%): 80 Station: -3 Exam by:: M.Merrill, Rn  Labs: Lab Results  Component Value Date   WBC 10.3 09/09/2015   HGB 11.6* 09/09/2015   HCT 34.2* 09/09/2015   MCV 88.8 09/09/2015   PLT 218 09/09/2015    Assessment / Plan: Augmentation of labor, progressing well  Labor: progressing; she is now thin and very soft; will place epidural now and initiate Pit Fetal Wellbeing:  Category I Pain Control:  Epidural Pre-eclampsia: no I/D:  GBS neg >> afebrile now, will monitor closely due to risk of chorio Anticipated MOD:  NSVD  Kathee Delton, MD,MS,  PGY2 09/10/2015 3:43 AM

## 2015-09-11 NOTE — Lactation Note (Signed)
This note was copied from the chart of Leah Lynnel Schult. Lactation Consultation Note  Patient Name: Leah Perez ZOXWR'U Date: 09/11/2015 Reason for consult: Follow-up assessment  Baby 29 hours old. Mom reports that her breasts are starting to fill. Discussed engorgement prevention/treatment. Mom states that she has a personal pump at home. Mom return-demonstrated hand expression with colostrum easily expressible. Discussed with mom that baby is her best pump and enc nursing often. Mom states that baby sometimes seems to want to sleep and not nurse for over 5 hours. Enc mom to undress baby and offer STS. Mom states that baby nursing well with #16 NS. Enc mom to attempt to latch without NS, especially now that her milk is coming to volume. Enc nursing with shield at beginning of feeding, and then latching without NS. Enc mom to call out for assistance with latching as needed. Mom aware to discuss baby's oral anatomy with pediatrician.  Maternal Data    Feeding    LATCH Score/Interventions                      Lactation Tools Discussed/Used Tools: Pump Nipple shield size: 16 Breast pump type: Double-Electric Breast Pump   Consult Status Consult Status: Follow-up Date: 09/12/15 Follow-up type: In-patient    [name] 09/11/2015, 11:38 AM

## 2015-09-11 NOTE — Clinical Social Work Maternal (Signed)
CLINICAL SOCIAL WORK MATERNAL/CHILD NOTE  Patient Details  Name: Leah Perez MRN: [id number] DatHALIMAH Perez-07-1991  Date:  09/11/2015  Clinical Social Worker Initiating Note:  Loleta Books MSW, LCSW Date/ Time Initiated:  09/11/15/1330     Child's Name:  Leah Perez   Legal Guardian:  Leah Perez and Leah Perez  Need for Interpreter:  None   Date of Referral:  09/10/15     Reason for Referral:  History of anxiety and depression  Referral Source:  [facility]   Address:  [address] [place], [place] [address]  Phone number:  [phone]   Household Members:  Parents   Natural Supports (not living in the home):  Spouse/significant other, Immediate Family   Professional Supports: Case Manager/Social Worker   Employment: Unemployed, Consulting civil engineer   Type of Work:   N/A  Education:  Holiday representative Resources:  Medicaid   Other Resources:    N/A  Cultural/Religious Considerations Which May Impact Care:  None reported  Strengths:  Ability to meet basic needs , Home prepared for child    Risk Factors/Current Problems:   1)Mental Health Concerns: MOB presents with history of anxiety and depression since Apr 09, 2007. MOB reported that she was prescribed Zoloft and Xanax until she learned of her pregnancy. She denied acute mental health needs during the pregnancy, no longer identifies her depression and anxiety as a presenting problem, but is agreeable to re-starting medications postpartum if she notes return of symptoms.     Cognitive State:  Able to Concentrate , Alert , Insightful , Goal Oriented , Linear Thinking    Mood/Affect:  Animated, Interested , Happy , Comfortable , Bright    CSW Assessment:  CSW received request for consult due to MOB presenting with a history of anxiety and depression.  MOB presented as easily engaged and receptive to the visit. She displayed a full range in affect and was noted to be in a pleasant mood. MOB did not  present with any symptoms of depression or anxiety, and her comments highlight her sense of self-awareness, ability to self-regulate, and when to reach out for help and support.   Per MOB, she was diagnosed with anxiety and depression in 04/09/07 after her best friend died. She stated that she has previously participated in therapy and medication management, but discontinued therapy a "few years ago". MOB shared that prior to the pregnancy, she was prescribed Xanax and Zoloft.  She denied any concerns about discontinuing the medication, and reported that she felt "better" in comparison to how she felt with the medication. CSW reviewed common symptoms, and MOB denied any symptoms of depression. She endorsed symptoms of anxiety as she reported that she would worry about "everything", but denied belief that it negatively impacted her ability to engage in daily activities. MOB denied panic attacks during the pregnancy and reported that she was able to engage in cognitive techniques to reduce symptoms.  MOB denied desire to restart medications at this time, but spoke at length about her willingness to re-start medications if she notes return of symptoms.  MOB vocalized which symptoms would indicate need to re-start medications, and she presents with insight on which symptoms would warrant medical intervention.   MOB continued to discuss her plans to support her mental health postpartum. She shared that she is aware of the importance of sleep, and intends to utilize her family support to ensure that she is able to rest and care for herself.  MOB reported that  she is also aware of her need to get out of the house on a regular basis to reduce feelings of being isolated and alone at home. She shared that she intends to incorporate small outings into her routine, and also expressed goal of securing part-time work once she has recovered from the delivery since she believes it will be best for her to have a small "break" from  parenting.  Overall, MOB does not identify her mental health as a presenting problem.  She stated that she feels confident in her ability to monitor her mood and to reach out for help if needs arise. She recognizes that perinatal mood symptoms may be outside of her control, and she stated that she can contact her OB or her OB Case Manager if she notes onset of symptoms.  MOB reported that as she transitions postpartum, she does not feel significantly anxious or overwhelmed. She shared that she intends to utilize previously learned coping skills and cognitive techniques in order to reduce anxiety. Per MOB, she has a strong support system and feels confident in her ability to ask for and receive help postpartum. MOB reported feelings of excitement as she transitions postpartum and begins to adjust to her new role as a mother. She confirmed that the home is prepared for the infant.   CSW Plan/Description:   1)Patient/Family Education: Perinatal mood disorders 2)No Further Intervention Required/No Barriers to Discharge    Leah Perez 09/11/2015, 2:30 PM

## 2015-09-11 NOTE — Progress Notes (Signed)
Post Partum Day 1 Subjective: up ad lib, voiding, tolerating PO, + flatus and issues with breast feeding  Objective: Blood pressure 107/50, pulse 87, temperature 98.6 F (37 C), temperature source Oral, resp. rate 18, height  (1.651 m), weight 183 lb (83.008 kg), last menstrual period 10/31/2014, SpO2 99 %, unknown if currently breastfeeding.  Physical Exam:  General: alert, cooperative, appears stated age and no distress Lochia: appropriate Uterine Fundus: firm Incision: n/a DVT Evaluation: No evidence of DVT seen on physical exam. Negative Homan's sign. No cords or calf tenderness. No significant calf/ankle edema.   Recent Labs  09/09/15 1415  HGB 11.6*  HCT 34.2*    Assessment/Plan: Plan for discharge tomorrow   LOS: 2 days   LAWSON, MARIE DARLENE 09/11/2015, 7:46 AM

## 2015-09-12 MED ORDER — IBUPROFEN 600 MG PO TABS: 600.0000 mg | ORAL_TABLET | Freq: Four times a day (QID) | ORAL | Status: DC | PRN

## 2015-09-12 NOTE — Lactation Note (Signed)
This note was copied from the chart of Leah Perez. Lactation Consultation Note  Follow up with mom for 50 hour old infant. Baby with 7 BF using # 16 NS, 2 cup feeds of 5 and 10 cc EBM and 1 bottle feed of 20 cc EBM. 4 voids and 4 stool in last 24 hours. Weight loss 8%.  Infant with short lower posterior frenulum that does extend over gum line and will cup a gloved finger. Mom reports infant just fed over the last hour for 30 minutes, she says he is sleepy at the breast requiring awakening. Enc her to use awakening techniques to keep infant at nursing at breast and to supplement infant post BF with 15 cc EBM. Moms breast are large with small nipples. Breasts are firm to touch. Mom is pumping with DEBP and is pumping up to 70 cc BM. Enc mom to feed at breast first using NS PRN and to post pump for comfort, encouraged her to attempt to feed infant without NS and to weare shells between feedings. Mom is using ice packs prior to pumping. Mom is going eat breakfast while icing breasts and to pump after breakfast. Mom is not sure if she and baby being D/C today. Asked mom to call me for next feeding for assessment.  Patient Name: Leah Perez ZOXWR'U Date: 09/12/2015 Reason for consult: Follow-up assessment   Maternal Data Formula Feeding for Exclusion: No Has patient been taught Hand Expression?: Yes  Feeding Feeding Type: Breast Fed Length of feed: 30 min  LATCH Score/Interventions Latch:  (unable to assess latch during shift)                    Lactation Tools Discussed/Used     Consult Status Consult Status: Follow-up Date: 09/12/15 Follow-up type: In-patient    Silas Flood Riordan Walle 09/12/2015, 9:05 AM

## 2015-09-12 NOTE — Lactation Note (Signed)
This note was copied from the chart of Leah Zarahi Wacha. Lactation Consultation Note  Follow up with mom prior to D/C. Mom reports she just fed infant a bottle of 25 cc EBM. She said she did not call since she decided to give a bottle. Mom has a Medela Pump in Style at home for pumping. She has a Ped appt tomorrow for infant. Engorgement Protocol discussed and handout given to mom. BM Storage guidelines reviewed. Enc. Mom to keep I/O record and take to [facility].  Reviewed BF information in Taking Care of Baby and Me, including I/O and BM Storage guidelines. Reiterated OP LC Services, Support Groups, and BF Resources. Mom is smoker, discussed inhibition of MER. Mom has Breast Shells, encouraged her to wear them between feeds.    Written Plan Made and given to mom:  1. Nurse infant at least every 2- 3 hours using #16 nipple shield as needed to initiate and maintain latch, awaken infant as needed. Attempt to remove NS in middle of feeding and attempt to relatch without it, return NS if infant will not latch without it. 2. Do not Skip feedings, if infant not fed at the breast, pump using DEBP x 15 minutes. Store milk per BM storage guidelines in Taking Care of Baby and Me Booklet. 3. Use awakening techniques to keep infant nursing at breast. Massage/compress breasts during feedings to maximize milk transfer 4. Use your breastmilk to supplement baby first and formula afterwards if needed 5. Supplement infant with 15 cc EBM after BF 6. Wear a supportive bra, no underwire bras 7. Apply ice packs to breasts for 10-20 minutes prior to feeding/pumping if engorgement is still present 8. Hand Express or pump to soften areola if needed before nursing, pump post feed to soften breast(s) as needed  9. Do not smoke 15-30 minutes before feeding/pumping as it inhibits MER 10. Wear breast shells between feedings, not during night 11. Call your MD if pain relief needed 12. Call [facility] office at [phone] if you have  questions/concerns or for OP appointment if not weaned off nipple shield by 1 week of age for feeding assessment.       Patient Name: Leah Perez WGNFA'O Date: 09/12/2015 Reason for consult: Follow-up assessment   Maternal Data    Feeding Feeding Type: Breast Fed  LATCH Score/Interventions                      Lactation Tools Discussed/Used     Consult Status Consult Status: Complete Follow-up type: Call as needed    Ed Blalock 09/12/2015, 1:01 PM

## 2015-09-12 NOTE — Discharge Summary (Signed)
OB Discharge Summary     Patient Name: Leah Perez DOB: 13-Sep-1991 MRN: [id number]  Date of admission: 09/09/2015 Delivering MD: Mickie Hillier D   Date of discharge: 09/12/2015  Admitting diagnosis: 39 WKS, LEAKING, CRAMPS Intrauterine pregnancy: [redacted]w[redacted]d     Secondary diagnosis: None     Discharge diagnosis: Term Pregnancy Delivered                                                                                                Post partum procedures:Cyst removed at introitus (base tied off with suture)  Augmentation: Cytotec  Complications: None  Hospital course:  Onset of Labor With Vaginal Delivery     24 y.o. yo G1P1001 at [redacted]w[redacted]d was admitted in Latent Laboron 09/09/2015. Patient had an uncomplicated labor course as follows:  Membrane Rupture Time/Date: 3:40 AM ,09/07/2015   Intrapartum Procedures: Episiotomy: None [1]                                         Lacerations:  1st degree [2]  Patient had a delivery of a Viable infant. 09/10/2015  Information for the patient's newborn:  Leah, Perez [[id number]]  Delivery Method: Vaginal, Spontaneous Delivery (Filed from Delivery Summary)    Pateint had an uncomplicated postpartum course.  She is ambulating, tolerating a regular diet, passing flatus, and urinating well. Patient is discharged home in stable condition on 09/12/2015  1:30 PM.    Physical exam  Filed Vitals:   09/10/15 1925 09/11/15 0600 09/11/15 1730 09/12/15 0539  BP: 120/64 107/50 109/55 105/36  Pulse: 96 87 90 87  Temp: 97.9 F (36.6 C) 98.6 F (37 C) 98.6 F (37 C) 97.7 F (36.5 C)  TempSrc: Oral  Oral Oral  Resp: Height:      Weight:      SpO2: 99%      General: alert and no distress Lochia: appropriate Uterine Fundus: firm Incision: N/A DVT Evaluation: No evidence of DVT seen on physical exam. Trace edema at ankles bilaterally.  Labs: Lab Results  Component Value Date   WBC 10.3 09/09/2015   HGB 11.6* 09/09/2015   HCT  34.2* 09/09/2015   MCV 88.8 09/09/2015   PLT 218 09/09/2015   CMP 04/07/2015  Glucose 117(H)  BUN 9  Creatinine 0.51  Sodium 138  Potassium 3.6  Chloride 106  CO2 25  Calcium 8.7(L)  Total Protein 6.1(L)  Total Bilirubin 0.2(L)  Alkaline Phos 41  AST 22  ALT 25    Discharge instruction: per After Visit Summary and "Baby and Me Booklet".  Medications:  Current facility-administered medications:  .  acetaminophen (TYLENOL) tablet 650 mg, 650 mg, Oral, Q4H PRN, Kathee Delton, MD .  benzocaine-Menthol (DERMOPLAST) 20-0.5 % topical spray 1 application, 1 application, Topical, PRN, Kathee Delton, MD, 1 application at 09/10/15 1008 .  witch hazel-glycerin (TUCKS) pad 1 application, 1 application, Topical, PRN, 1 application at 09/10/15 1740 **AND** dibucaine (NUPERCAINAL) 1 % rectal  ointment 1 application, 1 application, Rectal, PRN, Kathee Delton, MD, 1 application at 09/11/15 0909 .  diphenhydrAMINE (BENADRYL) capsule 25 mg, 25 mg, Oral, Q6H PRN, Kathee Delton, MD .  ibuprofen (ADVIL,MOTRIN) tablet 600 mg, 600 mg, Oral, 4 times per day, Kathee Delton, MD, 600 mg at 09/12/15 1128 .  lanolin ointment, , Topical, PRN, Kathee Delton, MD .  ondansetron [facility]) tablet 4 mg, 4 mg, Oral, Q4H PRN **OR** ondansetron (ZOFRAN) injection 4 mg, 4 mg, Intravenous, Q4H PRN, Kathee Delton, MD .  oxyCODONE-acetaminophen (PERCOCET/ROXICET) 5-325 MG per tablet 1 tablet, 1 tablet, Oral, Q4H PRN, Kathee Delton, MD, 1 tablet at 09/12/15 0048 .  oxytocin (PITOCIN) IV infusion 40 units in LR 1000 mL, 62.5 mL/hr, Intravenous, Continuous PRN, Kathee Delton, MD .  prenatal multivitamin tablet 1 tablet, 1 tablet, Oral, Q1200, Kathee Delton, MD, 1 tablet at 09/12/15 1128 .  senna-docusate (Senokot-S) tablet 2 tablet, 2 tablet, Oral, Q24H, Kathee Delton, MD, 2 tablet at 09/11/15 0007 .  simethicone (MYLICON) chewable tablet 80 mg, 80 mg, Oral, PRN, Kathee Delton, MD .  Tdap (BOOSTRIX) injection 0.5 mL, 0.5 mL, Intramuscular,  Once, Kathee Delton, MD .  zolpidem (AMBIEN) tablet 5 mg, 5 mg, Oral, QHS PRN, Kathee Delton, MD  Diet: routine diet  Activity: Advance as tolerated. Pelvic rest for 6 weeks.   Outpatient follow up:6 weeks  Postpartum contraception: Nexplanon  Newborn Data: Live born female  Birth Weight: 6 lb 13 oz (3090 g) APGAR: 8, 9  Baby Feeding: Breast Disposition:home with mother   09/12/2015 Federico Flake, MD

## 2015-09-13 LAB — TYPE AND SCREEN
ABO/RH(D): A NEG
Antibody Screen: POSITIVE
DAT, IgG: NEGATIVE
UNIT DIVISION: 0
UNIT DIVISION: 0

## 2015-09-14 ENCOUNTER — Encounter: Payer: Medicaid Other | Admitting: Obstetrics and Gynecology

## 2015-09-18 ENCOUNTER — Telehealth: Payer: Self-pay | Admitting: Obstetrics & Gynecology

## 2015-09-18 NOTE — Telephone Encounter (Signed)
Called on 09/18/2015 @ 0844 to give a list of providers as well as phone numbers for out patient circ., no answer, left voicemail.

## 2015-09-21 ENCOUNTER — Encounter: Payer: Medicaid Other | Admitting: Obstetrics and Gynecology

## 2015-10-16 ENCOUNTER — Ambulatory Visit: Payer: Medicaid Other | Admitting: Obstetrics and Gynecology

## 2015-10-26 ENCOUNTER — Ambulatory Visit (INDEPENDENT_AMBULATORY_CARE_PROVIDER_SITE_OTHER): Payer: Medicaid Other | Admitting: Obstetrics and Gynecology

## 2015-10-26 ENCOUNTER — Encounter: Payer: Self-pay | Admitting: Obstetrics and Gynecology

## 2015-10-26 ENCOUNTER — Encounter: Payer: Self-pay | Admitting: *Deleted

## 2015-10-26 VITALS — BP 118/78 | HR 120 | Resp 18 | Ht 65.0 in | Wt 151.0 lb

## 2015-10-26 DIAGNOSIS — F53 Postpartum depression: Secondary | ICD-10-CM

## 2015-10-26 DIAGNOSIS — Z30017 Encounter for initial prescription of implantable subdermal contraceptive: Secondary | ICD-10-CM | POA: Diagnosis not present

## 2015-10-26 DIAGNOSIS — O99345 Other mental disorders complicating the puerperium: Secondary | ICD-10-CM

## 2015-10-26 DIAGNOSIS — Z01812 Encounter for preprocedural laboratory examination: Secondary | ICD-10-CM

## 2015-10-26 DIAGNOSIS — Z30018 Encounter for initial prescription of other contraceptives: Secondary | ICD-10-CM

## 2015-10-26 LAB — POCT URINE PREGNANCY: Preg Test, Ur: NEGATIVE

## 2015-10-26 MED ORDER — ALPRAZOLAM 0.25 MG PO TABS: 0.2500 mg | ORAL_TABLET | Freq: Three times a day (TID) | ORAL | Status: AC | PRN

## 2015-10-26 MED ORDER — ETONOGESTREL 68 MG ~~LOC~~ IMPL
68.0000 mg | DRUG_IMPLANT | Freq: Once | SUBCUTANEOUS | Status: AC
  Administered 2015-10-26: 68 mg via SUBCUTANEOUS

## 2015-10-26 MED ORDER — SERTRALINE HCL 100 MG PO TABS: 100.0000 mg | ORAL_TABLET | Freq: Every day | ORAL | Status: DC

## 2015-10-26 MED ORDER — AMPHETAMINE-DEXTROAMPHET ER 20 MG PO CP24: 20.0000 mg | ORAL_CAPSULE | ORAL | Status: DC

## 2015-10-26 NOTE — Addendum Note (Signed)
Addended by: Gita KudoLASSITER, KRISTEN S on: 10/26/2015 01:21 PM   Modules accepted: Orders

## 2015-10-26 NOTE — Progress Notes (Signed)
  Subjective:     Leah Perez is a 24 y.o. female who presents for a postpartum visit. She is 6 weeks postpartum following a spontaneous vaginal delivery. I have fully reviewed the prenatal and intrapartum course. The delivery was at 38.4 gestational weeks. Outcome: spontaneous vaginal delivery. Anesthesia: epidural. Postpartum course has been uncomplicated. Baby's course has been uncomplicated. Baby is feeding by breast. Bleeding no bleeding. Bowel function is normal. Bladder function is normal. Patient is not sexually active. Contraception method is none. Postpartum depression screening: positive. Patient denies any suicidal/homicidal ideations. She has a history of depression and anxiety and would like to be restarted on her medications. She plans on following up with a physician for continues care     Review of Systems Pertinent items are noted in HPI.   Objective:    BP 118/78 mmHg  Pulse 120  Resp 18  Ht 5\' 5"  (1.651 m)  Wt 151 lb (68.493 kg)  BMI 25.13 kg/m2  General:  alert, cooperative and no distress   Breasts:  inspection negative, no nipple discharge or bleeding, no masses or nodularity palpable  Lungs: clear to auscultation bilaterally  Heart:  regular rate and rhythm  Abdomen: soft, non-tender; bowel sounds normal; no masses,  no organomegaly   Vulva:  normal  Vagina: normal vagina, no discharge, exudate, lesion, or erythema  Cervix:  multiparous appearance  Corpus: normal size, contour, position, consistency, mobility, non-tender  Adnexa:  normal adnexa and no mass, fullness, tenderness  Rectal Exam: Not performed.        Assessment:     Normal postpartum exam. Pap smear not done at today's visit.   Plan:    1. Contraception: Nexplanon patient is aware of the irregular bleeding associated with the type of birth control. Patient given informed consent, signed copy in the chart, time out was performed. Pregnancy test was negative. Appropriate time out taken.   Patient's left arm was prepped and draped in the usual sterile fashion.. The ruler used to measure and mark insertion area.  Patient was prepped with alcohol swab and then injected with 1 cc of 1% lidocaine with epinephrine.  Patient was prepped with betadine, Nexplanon removed form packaging.  Device confirmed in needle, then inserted full length of needle and withdrawn per handbook instructions.  Patient insertion site covered with steri strips and a pressure dressing.   Minimal blood loss.  Patient tolerated the procedure well.  2. Patient is medically cleared to resume all activities of daily living 3. Refill on Zoloft, Aderall and xanac provided until patient follows up with her physician 3. Follow up in: several weeks or as needed.

## 2015-12-07 ENCOUNTER — Ambulatory Visit: Payer: Medicaid Other | Admitting: Obstetrics & Gynecology

## 2017-10-20 ENCOUNTER — Emergency Department (HOSPITAL_BASED_OUTPATIENT_CLINIC_OR_DEPARTMENT_OTHER)
Admission: EM | Admit: 2017-10-20 | Discharge: 2017-10-20 | Disposition: A | Discharge status: Home / Self Care | Payer: Medicaid Other | Attending: Emergency Medicine | Admitting: Emergency Medicine

## 2017-10-20 ENCOUNTER — Other Ambulatory Visit: Payer: Self-pay

## 2017-10-20 ENCOUNTER — Encounter (HOSPITAL_BASED_OUTPATIENT_CLINIC_OR_DEPARTMENT_OTHER): Payer: Self-pay

## 2017-10-20 DIAGNOSIS — B9789 Other viral agents as the cause of diseases classified elsewhere: Secondary | ICD-10-CM | POA: Insufficient documentation

## 2017-10-20 DIAGNOSIS — J069 Acute upper respiratory infection, unspecified: Secondary | ICD-10-CM | POA: Diagnosis not present

## 2017-10-20 DIAGNOSIS — J029 Acute pharyngitis, unspecified: Secondary | ICD-10-CM | POA: Insufficient documentation

## 2017-10-20 DIAGNOSIS — F17210 Nicotine dependence, cigarettes, uncomplicated: Secondary | ICD-10-CM | POA: Diagnosis not present

## 2017-10-20 LAB — RAPID STREP SCREEN (MED CTR MEBANE ONLY): Streptococcus, Group A Screen (Direct): NEGATIVE

## 2017-10-20 NOTE — Discharge Instructions (Signed)
Your strep test is negative. This is likely a virus. You will feel your worse in the first few days, but get better over 1-2 weeks. Return for worsening symptoms, including new fevers, confusion, difficulty breathing, or any other symptoms concerning to you.

## 2017-10-20 NOTE — ED Triage Notes (Signed)
C/o sore throat, right earache x 2 days-NAD-steady gait 

## 2017-10-20 NOTE — ED Notes (Signed)
ED Provider at bedside. 

## 2017-10-20 NOTE — ED Provider Notes (Signed)
[facility] EMERGENCY DEPARTMENT Provider Note   CSN: [id number] Arrival date & time: 10/20/17  1327     History   Chief Complaint Chief Complaint  Patient presents with  . Sore Throat    HPI Leah Perez is a 26 y.o. female.  The history is provided by the patient.  Sore Throat  This is a new problem. The current episode started 2 days ago. The problem occurs constantly. The problem has been gradually worsening. Pertinent negatives include no abdominal pain, no headaches and no shortness of breath. The symptoms are aggravated by swallowing. Nothing relieves the symptoms. She has tried nothing for the symptoms.   26 year old female who presents with 2 days of sore throat and right ear pain.  She has an infant at home but recently started daycare and sick with upper respiratory symptoms as well.  She has had mild cough not productive of sputum, congestion.  Denies any fevers, difficulty breathing, nausea or vomiting, chest pain or abdominal pain.  Past Medical History:  Diagnosis Date  . ANXIETY 03/06/2009  . DEPRESSION 03/06/2009  . Depression   . Migraine     Patient Active Problem List   Diagnosis Date Noted  . NSVD (normal spontaneous vaginal delivery) 09/12/2015  . PROM (premature rupture of membranes) 09/09/2015  . Active labor at term 09/09/2015  . Supervision of normal pregnancy in second trimester 05/31/2015  . Rh negative status during pregnancy in second trimester, antepartum 05/31/2015  . ADHD (attention deficit hyperactivity disorder) 10/08/2012  . History of migraine headaches 08/07/2011  . Anxiety state 03/06/2009  . DEPRESSION 03/06/2009    Past Surgical History:  Procedure Laterality Date  . WISDOM TOOTH EXTRACTION      OB History    Gravida Para Term Preterm AB Living   1 1 1     1    SAB TAB Ectopic Multiple Live Births         0 1       Home Medications    Prior to Admission medications   Not on File    Family  History Family History  Problem Relation Age of Onset  . Diabetes Father   . Obesity Father   . Obesity Brother   . Alcohol abuse Brother     Social History Social History   Tobacco Use  . Smoking status: Current Every Day Smoker    Types: Cigarettes    Last attempt to quit: 12/10/2011    Years since quitting: 5.8  . Smokeless tobacco: Never Used  Substance Use Topics  . Alcohol use: Yes    Alcohol/week: 0.6 oz    Types: 1 Glasses of wine per week    Comment: occ  . Drug use: No     Allergies   Patient has no known allergies.   Review of Systems Review of Systems  Constitutional: Negative for fever.  HENT: Positive for congestion, ear pain and sore throat.   Respiratory: Negative for shortness of breath.   Gastrointestinal: Negative for abdominal pain, nausea and vomiting.  Allergic/Immunologic: Negative for immunocompromised state.  Neurological: Negative for headaches.  Hematological: Does not bruise/bleed easily.     Physical Exam Updated Vital Signs BP 124/75 (BP Location: Left Arm)   Pulse (!) 102   Temp 98.2 F (36.8 C) (Oral)   Resp 18   Ht 5\' 5"  (1.651 m)   Wt 65.8 kg (145 lb)   SpO2 98%   BMI 24.13 kg/m   Physical Exam  Physical Exam  Nursing note and vitals reviewed. Constitutional: Well developed, well nourished, non-toxic, and in no acute distress Head: Normocephalic and atraumatic.  Mouth/Throat: Oropharynx is clear and moist.  Neck: Normal range of motion. Neck supple.  Ear: bilateral TMs clear with no effusion, wax or bulging Cardiovascular: Normal rate and regular rhythm.   Pulmonary/Chest: Effort normal and breath sounds normal.  Abdominal: Soft. There is no tenderness. There is no rebound and no guarding.  Musculoskeletal: Normal range of motion.  Neurological: Alert, no facial droop, fluent speech, moves all extremities symmetrically Skin: Skin is warm and dry.  Psychiatric: Cooperative  ED Treatments / Results  Labs (all labs  ordered are listed, but only abnormal results are displayed) Labs Reviewed  RAPID STREP SCREEN (NOT AT [facility])  CULTURE, GROUP A STREP [facility])    EKG  EKG Interpretation None       Radiology No results found.  Procedures Procedures (including critical care time)  Medications Ordered in ED Medications - No data to display   Initial Impression / Assessment and Plan / ED Course  I have reviewed the triage vital signs and the nursing notes.  Pertinent labs & imaging results that were available during my care of the patient were reviewed by me and considered in my medical decision making (see chart for details).     Presents with symptoms likely consistent with viral upper respiratory infection.  Her strep test is negative.  She is well-appearing, speaking full sentences, protecting airway.  No suspicion for serious soft tissue neck infection or other acute bacterial illness at this time.  Recommended supportive care. Strict return and follow-up instructions reviewed. She expressed understanding of all discharge instructions and felt comfortable with the plan of care.   Final Clinical Impressions(s) / ED Diagnoses   Final diagnoses:  Viral pharyngitis  Viral URI with cough    ED Discharge Orders    None       Lavera GuiseLiu, Geni Skorupski Duo, MD 10/20/17 1556

## 2017-10-23 LAB — CULTURE, GROUP A STREP (THRC)

## 2018-03-10 ENCOUNTER — Ambulatory Visit: Payer: Medicaid Other | Admitting: Internal Medicine

## 2018-05-12 ENCOUNTER — Ambulatory Visit: Payer: Medicaid Other | Admitting: Internal Medicine

## 2018-06-11 ENCOUNTER — Encounter (HOSPITAL_BASED_OUTPATIENT_CLINIC_OR_DEPARTMENT_OTHER): Payer: Self-pay | Admitting: *Deleted

## 2018-06-11 ENCOUNTER — Emergency Department (HOSPITAL_BASED_OUTPATIENT_CLINIC_OR_DEPARTMENT_OTHER)
Admission: EM | Admit: 2018-06-11 | Discharge: 2018-06-11 | Disposition: A | Discharge status: Home / Self Care | Payer: Medicaid Other | Attending: Emergency Medicine | Admitting: Emergency Medicine

## 2018-06-11 ENCOUNTER — Other Ambulatory Visit: Payer: Self-pay

## 2018-06-11 DIAGNOSIS — J020 Streptococcal pharyngitis: Secondary | ICD-10-CM | POA: Insufficient documentation

## 2018-06-11 DIAGNOSIS — R070 Pain in throat: Secondary | ICD-10-CM | POA: Diagnosis present

## 2018-06-11 DIAGNOSIS — R030 Elevated blood-pressure reading, without diagnosis of hypertension: Secondary | ICD-10-CM | POA: Diagnosis not present

## 2018-06-11 DIAGNOSIS — F17210 Nicotine dependence, cigarettes, uncomplicated: Secondary | ICD-10-CM | POA: Diagnosis not present

## 2018-06-11 LAB — PREGNANCY, URINE: Preg Test, Ur: NEGATIVE

## 2018-06-11 LAB — RAPID STREP SCREEN (MED CTR MEBANE ONLY): Streptococcus, Group A Screen (Direct): POSITIVE — AB

## 2018-06-11 MED ORDER — KETOROLAC TROMETHAMINE 30 MG/ML IJ SOLN
30.0000 mg | Freq: Once | INTRAMUSCULAR | Status: AC
  Administered 2018-06-11: 30 mg via INTRAMUSCULAR
  Filled 2018-06-11: qty 1

## 2018-06-11 MED ORDER — PENICILLIN G BENZATHINE 1200000 UNIT/2ML IM SUSP
1.2000 10*6.[IU] | Freq: Once | INTRAMUSCULAR | Status: AC
  Administered 2018-06-11: 1.2 10*6.[IU] via INTRAMUSCULAR
  Filled 2018-06-11: qty 2

## 2018-06-11 MED ORDER — IBUPROFEN 600 MG PO TABS: 600.0000 mg | ORAL_TABLET | Freq: Four times a day (QID) | ORAL | 0 refills | Status: DC | PRN

## 2018-06-11 NOTE — ED Provider Notes (Signed)
[facility] EMERGENCY DEPARTMENT Provider Note   CSN: [id number] Arrival date & time: 06/11/18  1448     History   Chief Complaint Chief Complaint  Patient presents with  . Sore Throat    HPI Leah Perez is a 27 y.o. female.  HPI   Patient is a 27 year old female with a history of anxiety depression presenting for sore throat.  Patient reports a sore throat began 2 days ago.  Patient reports she has noticed swollen tonsils with white patches on them over the past 2 days.  Associated symptoms include right otalgia.  Patient reports she has had pain with swallowing, but no obstructive swallowing or difficulty breathing.  Patient denies any neck induration, submandibular tenderness, or change in voice quality.  No fevers or chills.  No cough.  No history of immunocompromise status.  No remedies attempted for symptoms thus far.  Past Medical History:  Diagnosis Date  . ANXIETY 03/06/2009  . DEPRESSION 03/06/2009  . Depression   . Migraine     Patient Active Problem List   Diagnosis Date Noted  . NSVD (normal spontaneous vaginal delivery) 09/12/2015  . PROM (premature rupture of membranes) 09/09/2015  . Active labor at term 09/09/2015  . Supervision of normal pregnancy in second trimester 05/31/2015  . Rh negative status during pregnancy in second trimester, antepartum 05/31/2015  . ADHD (attention deficit hyperactivity disorder) 10/08/2012  . History of migraine headaches 08/07/2011  . Anxiety state 03/06/2009  . DEPRESSION 03/06/2009    Past Surgical History:  Procedure Laterality Date  . WISDOM TOOTH EXTRACTION       OB History    Gravida  1   Para  1   Term  1   Preterm      AB      Living  1     SAB      TAB      Ectopic      Multiple  0   Live Births  1            Home Medications    Prior to Admission medications   Not on File    Family History Family History  Problem Relation Age of Onset  . Diabetes Father   .  Obesity Father   . Obesity Brother   . Alcohol abuse Brother     Social History Social History   Tobacco Use  . Smoking status: Current Every Day Smoker    Types: Cigarettes    Last attempt to quit: 12/10/2011    Years since quitting: 6.5  . Smokeless tobacco: Never Used  Substance Use Topics  . Alcohol use: Yes    Alcohol/week: 0.6 oz    Types: 1 Glasses of wine per week    Comment: occ  . Drug use: No     Allergies   Patient has no known allergies.   Review of Systems Review of Systems  Constitutional: Negative for chills and fever.  HENT: Positive for sore throat. Negative for congestion, facial swelling, tinnitus, trouble swallowing and voice change.   Respiratory: Negative for cough and stridor.      Physical Exam Updated Vital Signs BP 136/72   Pulse (!) 108   Temp 98.8 F (37.1 C) (Oral)   Resp 18   Ht 5\' 4"  (1.626 m)   Wt 65.8 kg (145 lb)   SpO2 98%   BMI 24.89 kg/m   Physical Exam  Constitutional: She appears well-developed and well-nourished. No  distress.  Sitting comfortably in bed.  HENT:  Head: Normocephalic and atraumatic.  Mouth/Throat: Tonsils are 2+ on the right. Tonsils are 2+ on the left. Tonsillar exudate.  Normal phonation. No muffled voice sounds. Patient swallows secretions without difficulty. Dentition normal. No lesions of tongue or buccal mucosa. Uvula midline. No asymmetric swelling of the posterior pharynx.Erythema of posterior pharynx. Tonsillar exuduate present. No lingual swelling. No induration inferior to tongue. No submandibular tenderness, swelling, or induration.  Tissues of the neck supple. No cervical lymphadenopathy. Right TM without erythema or effusion; left TM without erythema or effusion.  Eyes: Conjunctivae are normal. Right eye exhibits no discharge. Left eye exhibits no discharge.  EOMs normal to gross examination.  Neck: Normal range of motion.  Cardiovascular: Normal rate and regular rhythm.  Nontachycardic on  my examination.  Pulmonary/Chest: Effort normal and breath sounds normal. She has no wheezes. She has no rales.  Normal respiratory effort. Patient converses comfortably. No audible wheeze or stridor.  Abdominal: She exhibits no distension.  Musculoskeletal: Normal range of motion.  Neurological: She is alert.  Cranial nerves intact to gross observation. Patient moves extremities without difficulty.  Skin: Skin is warm and dry. She is not diaphoretic.  Psychiatric: She has a normal mood and affect. Her behavior is normal. Judgment and thought content normal.  Nursing note and vitals reviewed.    ED Treatments / Results  Labs (all labs ordered are listed, but only abnormal results are displayed) Labs Reviewed  RAPID STREP SCREEN ([facility] ONLY) - Abnormal; Notable for the following components:      Result Value   Streptococcus, Group A Screen (Direct) POSITIVE (*)    All other components within normal limits  PREGNANCY, URINE    EKG None  Radiology No results found.  Procedures Procedures (including critical care time)  Medications Ordered in ED Medications  ketorolac (TORADOL) 30 MG/ML injection 30 mg (has no administration in time range)  penicillin g benzathine (BICILLIN LA) 1200000 UNIT/2ML injection 1.2 Million Units (has no administration in time range)     Initial Impression / Assessment and Plan / ED Course  I have reviewed the triage vital signs and the nursing notes.  Pertinent labs & imaging results that were available during my care of the patient were reviewed by me and considered in my medical decision making (see chart for details).     Leah Perez is a 27 y.o. female who presents to ED for sore throat. Patient nontoxic appearing and in no acute distress. Rapid strep positive and clinical exam c/w strep.  Presentation not concerning for PTA or infection spread to soft tissue. No trismus or uvula deviation. Patient with normal phonation. Exam  demonstrates soft neck tissue, no swelling or induration inferior to the tongue or in the submandibular space  Treated in the ED with Toradol and IM penicillin G. Patient able to drink water in ED without difficulty with intact air way. Specific return precautions discussed for change in voice, inability to tolerate secretions, difficulty breathing or swallowing, or increased nausea or vomiting. Discussed importance of hydration. Recommended PCP follow up. All questions answered.  BP noted to pt. Recommended primary care follow up.  Final Clinical Impressions(s) / ED Diagnoses   Final diagnoses:  Strep pharyngitis  Elevated blood-pressure reading without diagnosis of hypertension    ED Discharge Orders        Ordered    ibuprofen (ADVIL,MOTRIN) 600 MG tablet  Every 6 hours PRN  06/11/18 1537       Aviva KluverMurray, Jaleiyah Alas B, PA-C 06/11/18 1544    Arby BarrettePfeiffer, Marcy, MD 06/11/18 2344

## 2018-06-11 NOTE — ED Triage Notes (Signed)
Sore throat since yesterday.

## 2018-06-11 NOTE — Discharge Instructions (Signed)
Please read and follow all provided instructions.  Your diagnoses today include:  1. Strep pharyngitis   2. Elevated blood-pressure reading without diagnosis of hypertension     Tests performed today include: Strep test: was POSITIVE for strep throat Vital signs. See below for your results today.   Medications prescribed:  You were given a one-time shot of penicillin to treat your strep throat.   You are prescribed ibuprofen, a non-steroidal anti-inflammatory agent (NSAID) for pain. You may take 600mg  every 6 hours as needed for pain. If still requiring this medication around the clock for acute pain after 10 days, please see your primary healthcare provider.  Women who are pregnant, breastfeeding, or planning on becoming pregnant should not take non-steroidal anti-inflammatories such as Advil and Aleve. Tylenol is a safe over the counter pain reliever in pregnant women.  You may combine this medication with Tylenol, 650 mg every 6 hours, so you are receiving something for pain every 3 hours.  This is not a long-term medication unless under the care and direction of your primary provider. Taking this medication long-term and not under the supervision of a healthcare provider could increase the risk of stomach ulcers, kidney problems, and cardiovascular problems such as high blood pressure.    Take any medications prescribed only as directed.   Home care instructions:  Please read the educational materials provided and follow any instructions contained in this packet.  Follow-up instructions: Please follow-up with your primary care provider as needed for further evaluation of your symptoms.  Return instructions:  Please return to the Emergency Department if you experience worsening symptoms.  Return if you have worsening problems swallowing, your neck becomes swollen, you cannot swallow your saliva or your voice becomes muffled.  Return with high persistent fever, persistent  vomiting, or if you have trouble breathing.  Please return if you have any other emergent concerns.  Additional Information:  To find a primary care or specialty doctor please call [phone] or [phone] to access "[facility] Find a Doctor Service."  You may also go on the [facility] website at [email or web address]  There are also multiple [facility], [place] and [facility] practices throughout the [place] that are frequently accepting new patients. You may find a clinic that is close to your home and contact them.  [facility] - [address] [place] [phone]  [facility] in [place] (also locations in [place] and [place]) - [address] [place] [phone]  [facility] - [address] [phone]   Your vital signs today were: BP 136/72    Pulse (!) 108    Temp 98.8 F (37.1 C) (Oral)    Resp 18    Ht 5\' 4"  (1.626 m)    Wt 65.8 kg (145 lb)    SpO2 98%    BMI 24.89 kg/m  If your blood pressure (BP) was elevated above 135/85 this visit, please have this repeated by your doctor within one month.

## 2020-05-11 ENCOUNTER — Other Ambulatory Visit: Payer: Self-pay

## 2020-05-11 ENCOUNTER — Encounter: Payer: Medicaid Other | Admitting: Internal Medicine

## 2020-05-17 ENCOUNTER — Other Ambulatory Visit (INDEPENDENT_AMBULATORY_CARE_PROVIDER_SITE_OTHER): Payer: 59

## 2020-05-17 ENCOUNTER — Ambulatory Visit (INDEPENDENT_AMBULATORY_CARE_PROVIDER_SITE_OTHER): Payer: 59 | Admitting: Family Medicine

## 2020-05-17 ENCOUNTER — Encounter: Payer: Self-pay | Admitting: Family Medicine

## 2020-05-17 ENCOUNTER — Other Ambulatory Visit: Payer: Self-pay

## 2020-05-17 VITALS — BP 128/70 | HR 100 | Temp 98.0°F | Ht 64.0 in | Wt 142.2 lb

## 2020-05-17 DIAGNOSIS — E079 Disorder of thyroid, unspecified: Secondary | ICD-10-CM

## 2020-05-17 DIAGNOSIS — F411 Generalized anxiety disorder: Secondary | ICD-10-CM | POA: Diagnosis not present

## 2020-05-17 DIAGNOSIS — F900 Attention-deficit hyperactivity disorder, predominantly inattentive type: Secondary | ICD-10-CM

## 2020-05-17 DIAGNOSIS — R5383 Other fatigue: Secondary | ICD-10-CM | POA: Diagnosis not present

## 2020-05-17 DIAGNOSIS — E049 Nontoxic goiter, unspecified: Secondary | ICD-10-CM | POA: Diagnosis not present

## 2020-05-17 DIAGNOSIS — Z131 Encounter for screening for diabetes mellitus: Secondary | ICD-10-CM | POA: Diagnosis not present

## 2020-05-17 DIAGNOSIS — H0520 Unspecified exophthalmos: Secondary | ICD-10-CM | POA: Diagnosis not present

## 2020-05-17 DIAGNOSIS — Z13220 Encounter for screening for lipoid disorders: Secondary | ICD-10-CM | POA: Diagnosis not present

## 2020-05-17 LAB — CBC WITH DIFFERENTIAL/PLATELET
Basophils Absolute: 0 10*3/uL (ref 0.0–0.1)
Basophils Relative: 0.5 % (ref 0.0–3.0)
Eosinophils Absolute: 0.2 10*3/uL (ref 0.0–0.7)
Eosinophils Relative: 2.6 % (ref 0.0–5.0)
HCT: 39.7 % (ref 36.0–46.0)
Hemoglobin: 13.5 g/dL (ref 12.0–15.0)
Lymphocytes Relative: 27.1 % (ref 12.0–46.0)
Lymphs Abs: 2 10*3/uL (ref 0.7–4.0)
MCHC: 34.1 g/dL (ref 30.0–36.0)
MCV: 87.1 fl (ref 78.0–100.0)
Monocytes Absolute: 0.6 10*3/uL (ref 0.1–1.0)
Monocytes Relative: 8.3 % (ref 3.0–12.0)
Neutro Abs: 4.5 10*3/uL (ref 1.4–7.7)
Neutrophils Relative %: 61.5 % (ref 43.0–77.0)
Platelets: 230 10*3/uL (ref 150.0–400.0)
RBC: 4.56 Mil/uL (ref 3.87–5.11)
RDW: 13.5 % (ref 11.5–15.5)
WBC: 7.3 10*3/uL (ref 4.0–10.5)

## 2020-05-17 LAB — COMPREHENSIVE METABOLIC PANEL
ALT: 20 U/L (ref 0–35)
AST: 14 U/L (ref 0–37)
Albumin: 4 g/dL (ref 3.5–5.2)
Alkaline Phosphatase: 122 U/L — ABNORMAL HIGH (ref 39–117)
BUN: 16 mg/dL (ref 6–23)
CO2: 28 mEq/L (ref 19–32)
Calcium: 8.9 mg/dL (ref 8.4–10.5)
Chloride: 105 mEq/L (ref 96–112)
Creatinine, Ser: 0.47 mg/dL (ref 0.40–1.20)
GFR: 156.27 mL/min (ref >60.00)
Glucose, Bld: 151 mg/dL — ABNORMAL HIGH (ref 70–99)
Potassium: 4.2 mEq/L (ref 3.5–5.1)
Sodium: 139 mEq/L (ref 135–145)
Total Bilirubin: 0.3 mg/dL (ref 0.2–1.2)
Total Protein: 6.6 g/dL (ref 6.0–8.3)

## 2020-05-17 LAB — LIPID PANEL
Cholesterol: 115 mg/dL (ref 0–200)
HDL: 38.4 mg/dL — ABNORMAL LOW (ref >39.00)
LDL Cholesterol: 43 mg/dL (ref 0–99)
NonHDL: 76.81
Total CHOL/HDL Ratio: 3
Triglycerides: 170 mg/dL — ABNORMAL HIGH (ref 0.0–149.0)
VLDL: 34 mg/dL (ref 0.0–40.0)

## 2020-05-17 LAB — T3, FREE: T3, Free: 10.3 pg/mL — ABNORMAL HIGH (ref 2.3–4.2)

## 2020-05-17 LAB — IBC + FERRITIN
Ferritin: 29.4 ng/mL (ref 10.0–291.0)
Iron: 71 ug/dL (ref 42–145)
Saturation Ratios: 20.6 % (ref 20.0–50.0)
Transferrin: 246 mg/dL (ref 212.0–360.0)

## 2020-05-17 LAB — T4, FREE: Free T4: 3.85 ng/dL — ABNORMAL HIGH (ref 0.60–1.60)

## 2020-05-17 LAB — VITAMIN D 25 HYDROXY (VIT D DEFICIENCY, FRACTURES): VITD: 42.35 ng/mL (ref 30.00–100.00)

## 2020-05-17 LAB — TSH: TSH: <0.01 u[IU]/mL — ABNORMAL LOW (ref 0.35–4.50)

## 2020-05-17 LAB — VITAMIN B12: Vitamin B-12: 248 pg/mL (ref 211–911)

## 2020-05-17 MED ORDER — NEXPLANON 68 MG ~~LOC~~ IMPL: 1.0000 | DRUG_IMPLANT | Freq: Once | SUBCUTANEOUS | 0 refills | Status: DC

## 2020-05-17 MED ORDER — AMPHETAMINE-DEXTROAMPHETAMINE 20 MG PO TABS: 20.0000 mg | ORAL_TABLET | Freq: Every day | ORAL | 0 refills | Status: DC

## 2020-05-17 MED ORDER — ALPRAZOLAM 0.5 MG PO TABS
0.5000 mg | ORAL_TABLET | Freq: Every day | ORAL | 0 refills | Status: DC | PRN
Start: 2020-05-17 — End: 2020-06-21

## 2020-05-17 MED ORDER — SERTRALINE HCL 50 MG PO TABS: 50.0000 mg | ORAL_TABLET | Freq: Every day | ORAL | 1 refills | Status: DC

## 2020-05-17 NOTE — Progress Notes (Signed)
Leah Perez DOB: 1991-02-12 Encounter date: 05/17/2020  This I sa 29 y.o. female who presents to establish care. Chief Complaint  Patient presents with  . Establish Care    History of present illness: Previously did see Dr. Burnice Logan in our office, but last visit was in 2016. Has not had insurance. She stays at home with son. Once she went back to work she got insurance.   She is worried about thyroid. Has puffiness under eyes; noting thyroid enlargement and has been this way for a couple of years. Just getting worse.  Has not had thyroid issues in the past.    Anxiety: through the roof. Dad got cancer and had to get bone marrow transplant. Was in hospital 91 days; did clinical trial. Very stressful process. Non hodgkins, mast cell leukemia. Anxiety level so high that she would have to be held to calm down or talk with mom until able to calm down.   Boyfriends dad also dx with brain cancer. She has been bringing his dad to appointments and being there to care for him. 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 Also selling house and renovating house. Feels edgy; not sure if combination of anxiety and depression. Has been to counseling/therapy in past and using tools from this. Did well with zoloft in past. Didn't do well with lexapro. Was 15 when antidepressants were started. Saw psychiatry. Tried on multiple meds she didn't do well with. Also did well with xanax in the past.   Migraine: was on imitrex in the past. She takes "a lot of aleve" now. Takes 1 and then another in 30 min then 2 more a couple hours later. Was taking 4 bc powders daily.   ADHD: focus and attention are difficult. She is a Secondary school teacher for affordable housing. States that focus is completely out the window. Diagnosed in 1st grade but not on medication until she was older. Doesn't recall difficulties with falling asleep when on the adderall.    Past  Medical History:  Diagnosis Date  . ANXIETY 03/06/2009  . DEPRESSION 03/06/2009  . Depression   . Migraine   . NSVD (normal spontaneous vaginal delivery) 09/12/2015  . PROM (premature rupture of membranes) 09/09/2015  . Rh negative status during pregnancy in second trimester, antepartum 05/31/2015   [ ] Rhophylac    Past Surgical History:  Procedure Laterality Date  . WISDOM TOOTH EXTRACTION     Allergies  Allergen Reactions  . Doxycycline Hives and Rash   Current Meds  Medication Sig  . ibuprofen (ADVIL,MOTRIN) 600 MG tablet Take 1 tablet (600 mg total) by mouth every 6 (six) hours as needed.   Social History   Tobacco Use  . Smoking status: Current Every Day Smoker    Types: Cigarettes    Last attempt to quit: 12/10/2011    Years since quitting: 8.4  . Smokeless tobacco: Never Used  . Tobacco comment: on occasion  Substance Use Topics  . Alcohol use: Yes    Alcohol/week: 1.0 standard drink    Types: 1 Glasses of wine per week    Comment: occ   Family History  Problem Relation Age of Onset  . Breast cancer Mother 83       "estrogen fed"  . Diabetes Father   . Obesity Father   . Heart attack Father   . Leukemia Father        mast cell leukemia  . Obesity Brother   .  Alcohol abuse Brother   . Other Brother        ?asbergers, cog delay  . Cervical cancer Maternal Grandmother   . Heart disease Maternal Grandfather        pacemaker  . Leukemia Paternal Grandmother   . Liver disease Brother      Review of Systems  Constitutional: Negative for chills, fatigue and fever.  Eyes:       Eyes are bugged   Respiratory: Negative for cough, chest tightness, shortness of breath and wheezing.   Cardiovascular: Negative for chest pain, palpitations and leg swelling.    Objective:  BP 128/70 (BP Location: Left Arm, Patient Position: Sitting, Cuff Size: Normal)   Pulse 100   Temp 98 F (36.7 C) (Temporal)   Ht 5\' 4"  (1.626 m)   Wt 142 lb 3.2 oz (64.5 kg)   LMP  05/14/2020 (Exact Date)   BMI 24.41 kg/m   Weight: 142 lb 3.2 oz (64.5 kg)   BP Readings from Last 3 Encounters:  05/17/20 128/70  06/11/18 136/72  10/20/17 124/75   Wt Readings from Last 3 Encounters:  05/17/20 142 lb 3.2 oz (64.5 kg)  06/11/18 145 lb (65.8 kg)  10/20/17 145 lb (65.8 kg)    Physical Exam Constitutional:      General: She is not in acute distress.    Appearance: She is well-developed.  Eyes:     Comments: Exophthalmos  Neck:     Thyroid: Thyromegaly present. No thyroid mass or thyroid tenderness.  Cardiovascular:     Rate and Rhythm: Normal rate and regular rhythm.     Heart sounds: Normal heart sounds. No murmur heard.  No friction rub.  Pulmonary:     Effort: Pulmonary effort is normal. No respiratory distress.     Breath sounds: Normal breath sounds. No wheezing or rales.  Musculoskeletal:     Right lower leg: No edema.     Left lower leg: No edema.  Lymphadenopathy:     Head:     Right side of head: Submandibular (1cm) adenopathy present. No submental adenopathy.     Left side of head: Submandibular (1cm) adenopathy present. No submental adenopathy.     Cervical: Cervical adenopathy present.     Right cervical: No superficial cervical adenopathy.    Left cervical: No superficial cervical adenopathy.  Neurological:     Mental Status: She is alert and oriented to person, place, and time.  Psychiatric:        Behavior: Behavior normal.     Assessment/Plan:  1. Exophthalmos Strongly suspect thyroid abnormality.  We will start with blood work today and follow-up pending this.  2. Thyroid enlargement See above.  Also obtain thyroid ultrasound. - TSH; Future - T4, free; Future - T3, free; Future - 13/12/18 Soft Tissue Head/Neck (NON-THYROID); Future  3. Anxiety state She tolerated Zoloft well in the past.  We will restart this medication for her.  We are starting on a 50 mg dose (she was on 800 mg dose in the past.  4. Attention deficit  hyperactivity disorder (ADHD), predominantly inattentive type She has not felt like Adderall increased anxiety in the past.  We will start her back on this to help with focus. - amphetamine-dextroamphetamine (ADDERALL) 20 MG tablet; Take 1 tablet (20 mg total) by mouth daily.  Dispense: 30 tablet; Refill: 0  5. Lipid screening - Lipid panel; Future  6. Screening for diabetes mellitus - Comprehensive metabolic panel; Future  7. Fatigue, unspecified type -  CBC with Differential/Platelet; Future - Comprehensive metabolic panel; Future - Vitamin B12; Future - VITAMIN D 25 Hydroxy (Vit-D Deficiency, Fractures); Future - IBC + Ferritin; Future  Return in about 1 month (around 06/16/2020) for Chronic condition visit.  Theodis Shove, MD

## 2020-05-18 ENCOUNTER — Telehealth: Payer: Self-pay | Admitting: Family Medicine

## 2020-05-18 ENCOUNTER — Other Ambulatory Visit (INDEPENDENT_AMBULATORY_CARE_PROVIDER_SITE_OTHER): Payer: 59

## 2020-05-18 ENCOUNTER — Other Ambulatory Visit: Payer: Self-pay | Admitting: Family Medicine

## 2020-05-18 DIAGNOSIS — R739 Hyperglycemia, unspecified: Secondary | ICD-10-CM | POA: Diagnosis not present

## 2020-05-18 MED ORDER — AMPHETAMINE-DEXTROAMPHET ER 20 MG PO CP24: 20.0000 mg | ORAL_CAPSULE | Freq: Every day | ORAL | 0 refills | Status: DC

## 2020-05-18 NOTE — Telephone Encounter (Signed)
Yes I sent the wrong one! So sorry. I will resend the extended release. If she has already picked it up, there is not much I can do about that now but she could hold on to it if she has a longer day of work and feels she needs a little lunch time dose - could break in half. OR; she could take it BID. I will go ahead and send the new rx for the adderall 20 XR though.

## 2020-05-18 NOTE — Telephone Encounter (Signed)
Patient called back and was informed of the message below.   

## 2020-05-18 NOTE — Telephone Encounter (Signed)
Left a message for the pt to return my call.  

## 2020-05-18 NOTE — Telephone Encounter (Signed)
Pt was prescribed Adderall 20 mg tablets yesterday. Per pt, she believes she was given the wrong medication and it should be the extended release. Pt would like to know if she was given the wrong prescription because Dr. Hassan Rowan, had mentioned changing her to an extended release. Thanks

## 2020-05-18 NOTE — Telephone Encounter (Signed)
Left message for pt to relay message below.

## 2020-05-19 LAB — HEMOGLOBIN A1C: Hgb A1c MFr Bld: 5.2 % (ref 4.6–6.5)

## 2020-05-26 ENCOUNTER — Ambulatory Visit
Admission: RE | Admit: 2020-05-26 | Discharge: 2020-05-26 | Disposition: A | Discharge status: Home / Self Care | Payer: 59 | Source: Ambulatory Visit | Attending: Family Medicine | Admitting: Family Medicine

## 2020-05-26 DIAGNOSIS — E049 Nontoxic goiter, unspecified: Secondary | ICD-10-CM

## 2020-05-29 ENCOUNTER — Other Ambulatory Visit: Payer: Self-pay | Admitting: *Deleted

## 2020-05-29 DIAGNOSIS — E041 Nontoxic single thyroid nodule: Secondary | ICD-10-CM

## 2020-06-21 ENCOUNTER — Encounter: Payer: Self-pay | Admitting: Family Medicine

## 2020-06-21 ENCOUNTER — Other Ambulatory Visit: Payer: Self-pay

## 2020-06-21 ENCOUNTER — Ambulatory Visit: Payer: 59 | Admitting: Family Medicine

## 2020-06-21 VITALS — BP 110/58 | HR 111 | Temp 97.7°F | Ht 64.0 in | Wt 138.6 lb

## 2020-06-21 DIAGNOSIS — H0260 Xanthelasma of unspecified eye, unspecified eyelid: Secondary | ICD-10-CM

## 2020-06-21 DIAGNOSIS — F411 Generalized anxiety disorder: Secondary | ICD-10-CM

## 2020-06-21 DIAGNOSIS — F900 Attention-deficit hyperactivity disorder, predominantly inattentive type: Secondary | ICD-10-CM

## 2020-06-21 MED ORDER — ALPRAZOLAM 0.5 MG PO TABS: 0.5000 mg | ORAL_TABLET | Freq: Every day | ORAL | 0 refills | Status: DC | PRN

## 2020-06-21 MED ORDER — AMPHETAMINE-DEXTROAMPHET ER 20 MG PO CP24: 20.0000 mg | ORAL_CAPSULE | Freq: Every day | ORAL | 0 refills | Status: DC

## 2020-06-21 MED ORDER — AMPHETAMINE-DEXTROAMPHET ER 20 MG PO CP24
20.0000 mg | ORAL_CAPSULE | Freq: Every day | ORAL | 0 refills | Status: DC
Start: 2020-06-21 — End: 2020-09-08

## 2020-06-21 NOTE — Progress Notes (Signed)
Leah Perez DOB: [birth year] Encounter date: 06/21/2020  This is a 29 y.o. female who presents with Chief Complaint  Patient presents with  . Anxiety    History of present illness: After last visit referred to endo. She hasn't heard anything.   Started on zoloft along with adderall for anxiety and ADD.   Feels like she did pretty well overall with new medications. Father in law passed away 2 weeks ago. Was out of work x 3 weeks; just restarted yesterday. Has used quite a few of the xanax, but does have some left.   Felt like she did ok with the adderall. Xr. Hard to judge how it did with all that was happening in her life. Would like to stay on current dose and just monitor for awhile. (has the instant release mostly left over - 24 tablets which provider had accidentally sent in).   Doing ok with the zoloft at 50mg  daily. Feels likes stress and anxiety are going to stabilize at this point.    Allergies  Allergen Reactions  . Doxycycline Hives and Rash   Current Meds  Medication Sig  . ALPRAZolam (XANAX) 0.5 MG tablet Take 1 tablet (0.5 mg total) by mouth daily as needed for anxiety.  amphetamine-dextroamphetamine (ADDERALL XR) 20 MG 24 hr capsule Take 1 capsule (20 mg total) by mouth daily.  Marland Kitchen ibuprofen (ADVIL,MOTRIN) 600 MG tablet Take 1 tablet (600 mg total) by mouth every 6 (six) hours as needed.  . sertraline (ZOLOFT) 50 MG tablet Take 1 tablet (50 mg total) by mouth daily.  . [DISCONTINUED] ALPRAZolam (XANAX) 0.5 MG tablet Take 1 tablet (0.5 mg total) by mouth daily as needed for anxiety.  . [DISCONTINUED] amphetamine-dextroamphetamine (ADDERALL XR) 20 MG 24 hr capsule Take 1 capsule (20 mg total) by mouth daily.    Review of Systems  Constitutional: Negative for chills, fatigue and fever.  Respiratory: Negative for cough, chest tightness, shortness of breath and wheezing.   Cardiovascular: Negative for chest pain, palpitations and leg swelling.   Psychiatric/Behavioral: The patient is nervous/anxious.     Objective:  BP (!) 110/58 (BP Location: Left Arm, Patient Position: Sitting, Cuff Size: Normal)   Pulse (!) 111   Temp 97.7 F (36.5 C) (Oral)   Ht 5\' 4"  (1.626 m)   Wt 138 lb 9.6 oz (62.9 kg)   LMP 05/15/2020 (Approximate) Comment: Nexplanon  BMI 23.79 kg/m   Weight: 138 lb 9.6 oz (62.9 kg)   BP Readings from Last 3 Encounters:  06/21/20 (!) 110/58  05/17/20 128/70  06/11/18 136/72   Wt Readings from Last 3 Encounters:  06/21/20 138 lb 9.6 oz (62.9 kg)  05/17/20 142 lb 3.2 oz (64.5 kg)  06/11/18 145 lb (65.8 kg)    Physical Exam Constitutional:      General: She is not in acute distress.    Appearance: She is well-developed.  Cardiovascular:     Rate and Rhythm: Normal rate and regular rhythm.     Heart sounds: Normal heart sounds. No murmur heard.  No friction rub.  Pulmonary:     Effort: Pulmonary effort is normal. No respiratory distress.     Breath sounds: Normal breath sounds. No wheezing or rales.  Musculoskeletal:     Right lower leg: No edema.     Left lower leg: No edema.  Neurological:     Mental Status: She is alert and oriented to person, place, and time.  Psychiatric:  Behavior: Behavior normal.     Assessment/Plan  1. Attention deficit hyperactivity disorder (ADHD), predominantly inattentive type She will let me know how this is doing for her in a couple of months. - amphetamine-dextroamphetamine (ADDERALL XR) 20 MG 24 hr capsule; Take 1 capsule (20 mg total) by mouth daily.  Dispense: 30 capsule; Refill: 0  2. Anxiety state See above. Refilled today. Use sparingly. - ALPRAZolam (XANAX) 0.5 MG tablet; Take 1 tablet (0.5 mg total) by mouth daily as needed for anxiety.  Dispense: 20 tablet; Refill: 0  3. Xanthelasma - Ambulatory referral to Dermatology   Referral for endo placed again today.  Return in about 6 months (around 12/22/2020) for physical exam.    Theodis Shove, MD

## 2020-06-21 NOTE — Patient Instructions (Signed)
Send me a message in mychart in 1-2 months to update on attention and mood.

## 2020-07-24 ENCOUNTER — Encounter: Payer: Self-pay | Admitting: Family Medicine

## 2020-07-24 NOTE — Telephone Encounter (Signed)
Please give her numbers through [facility] for financial assistance? She does need to be getting specialty care, so see if they can help her through this.

## 2020-07-27 ENCOUNTER — Encounter: Payer: Self-pay | Admitting: Family Medicine

## 2020-07-28 ENCOUNTER — Other Ambulatory Visit: Payer: Self-pay | Admitting: Family Medicine

## 2020-07-28 DIAGNOSIS — F411 Generalized anxiety disorder: Secondary | ICD-10-CM

## 2020-07-28 DIAGNOSIS — F900 Attention-deficit hyperactivity disorder, predominantly inattentive type: Secondary | ICD-10-CM

## 2020-07-28 MED ORDER — ALPRAZOLAM 0.5 MG PO TABS: 0.5000 mg | ORAL_TABLET | Freq: Every day | ORAL | 0 refills | Status: DC | PRN

## 2020-07-28 MED ORDER — AMPHETAMINE-DEXTROAMPHET ER 20 MG PO CP24: 20.0000 mg | ORAL_CAPSULE | Freq: Every day | ORAL | 0 refills | Status: DC

## 2020-07-28 NOTE — Telephone Encounter (Signed)
Forwarding to PCP for approval  

## 2020-08-25 ENCOUNTER — Encounter: Payer: Self-pay | Admitting: Family Medicine

## 2020-09-06 ENCOUNTER — Encounter: Payer: Self-pay | Admitting: Family Medicine

## 2020-09-06 ENCOUNTER — Other Ambulatory Visit: Payer: Self-pay | Admitting: Family Medicine

## 2020-09-06 DIAGNOSIS — F900 Attention-deficit hyperactivity disorder, predominantly inattentive type: Secondary | ICD-10-CM

## 2020-09-06 DIAGNOSIS — F411 Generalized anxiety disorder: Secondary | ICD-10-CM

## 2020-09-08 ENCOUNTER — Other Ambulatory Visit: Payer: Self-pay | Admitting: Family Medicine

## 2020-09-08 MED ORDER — SERTRALINE HCL 50 MG PO TABS: 50.0000 mg | ORAL_TABLET | Freq: Every day | ORAL | 1 refills | Status: DC

## 2020-09-08 MED ORDER — AMPHETAMINE-DEXTROAMPHET ER 20 MG PO CP24: 20.0000 mg | ORAL_CAPSULE | Freq: Every day | ORAL | 0 refills | Status: DC

## 2020-09-19 ENCOUNTER — Encounter: Payer: Self-pay | Admitting: Family Medicine

## 2020-09-19 DIAGNOSIS — F411 Generalized anxiety disorder: Secondary | ICD-10-CM

## 2020-09-22 MED ORDER — ALPRAZOLAM 0.5 MG PO TABS: 0.5000 mg | ORAL_TABLET | Freq: Every day | ORAL | 0 refills | Status: DC | PRN

## 2020-09-22 NOTE — Telephone Encounter (Signed)
Can you call in refill of xanax please. Still keep at 20 pills. Thanks. (due to system being down)

## 2020-10-04 ENCOUNTER — Encounter: Payer: Self-pay | Admitting: Family Medicine

## 2020-10-04 ENCOUNTER — Ambulatory Visit: Payer: Medicaid Other | Admitting: Family Medicine

## 2020-10-04 ENCOUNTER — Telehealth: Payer: Self-pay | Admitting: Family Medicine

## 2020-10-04 ENCOUNTER — Other Ambulatory Visit: Payer: Self-pay

## 2020-10-04 VITALS — HR 124 | Temp 98.1°F | Ht 64.25 in | Wt 138.9 lb

## 2020-10-04 DIAGNOSIS — Z720 Tobacco use: Secondary | ICD-10-CM | POA: Diagnosis not present

## 2020-10-04 DIAGNOSIS — F411 Generalized anxiety disorder: Secondary | ICD-10-CM

## 2020-10-04 DIAGNOSIS — E0590 Thyrotoxicosis, unspecified without thyrotoxic crisis or storm: Secondary | ICD-10-CM | POA: Diagnosis not present

## 2020-10-04 DIAGNOSIS — F900 Attention-deficit hyperactivity disorder, predominantly inattentive type: Secondary | ICD-10-CM

## 2020-10-04 MED ORDER — SERTRALINE HCL 100 MG PO TABS: 100.0000 mg | ORAL_TABLET | Freq: Every day | ORAL | 1 refills | Status: DC

## 2020-10-04 MED ORDER — AMPHETAMINE-DEXTROAMPHETAMINE 5 MG PO TABS: 5.0000 mg | ORAL_TABLET | Freq: Every day | ORAL | 0 refills | Status: DC | PRN

## 2020-10-04 MED ORDER — AMPHETAMINE-DEXTROAMPHET ER 20 MG PO CP24: 20.0000 mg | ORAL_CAPSULE | Freq: Every day | ORAL | 0 refills | Status: DC
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
## 2020-10-04 NOTE — Telephone Encounter (Signed)
Pt call and want dr.koberlein to give her a call didn't stated what she wanted.

## 2020-10-04 NOTE — Patient Instructions (Signed)
Call the office in [place] to get established with endocrinology. Let me know when your appointment is so that we can determine what follow up we should do (labs, etc)

## 2020-10-10 NOTE — Addendum Note (Signed)
Addended by: Wynn Banker on: 10/10/2020 09:08 PM   Modules accepted: Kipp Brood

## 2020-10-11 NOTE — Telephone Encounter (Signed)
error 

## 2020-11-01 ENCOUNTER — Telehealth: Payer: Self-pay | Admitting: Family Medicine

## 2020-11-01 ENCOUNTER — Encounter: Payer: Self-pay | Admitting: Family Medicine

## 2020-11-01 ENCOUNTER — Other Ambulatory Visit: Payer: Self-pay | Admitting: Family Medicine

## 2020-11-01 DIAGNOSIS — F411 Generalized anxiety disorder: Secondary | ICD-10-CM

## 2020-11-01 MED ORDER — AMPHETAMINE-DEXTROAMPHETAMINE 5 MG PO TABS: 5.0000 mg | ORAL_TABLET | Freq: Every day | ORAL | 0 refills | Status: DC | PRN

## 2020-11-01 MED ORDER — ALPRAZOLAM 0.5 MG PO TABS: 0.5000 mg | ORAL_TABLET | Freq: Every day | ORAL | 0 refills | Status: DC | PRN

## 2020-11-01 MED ORDER — AMPHETAMINE-DEXTROAMPHET ER 20 MG PO CP24: 20.0000 mg | ORAL_CAPSULE | Freq: Every day | ORAL | 0 refills | Status: DC

## 2020-11-01 NOTE — Telephone Encounter (Signed)
Pt is calling in needing a refill on Rx's alprazolam (XANAX) 0.5 MG and amphetamine-dextroamphetamine (ADDERALL XR) 20 MG.  Ph[facility] [address] in [place], [place]  If you need to speak with her about the Prudy Feeler being refilled too soon you can give her a call, but she is out of the medication.

## 2020-11-01 NOTE — Telephone Encounter (Signed)
refilled 

## 2020-11-09 IMAGING — US US THYROID
2 series · 13 of 25 positions shown · non-contrast
Comparison: None.

CLINICAL DATA: Palpable abnormality.

EXAM:
THYROID ULTRASOUND
TECHNIQUE: Ultrasound examination of the thyroid gland and adjacent soft
tissues was performed.

[Series 1: us thyroid · 0.06mm/px · 62 acquisitions, 12 frames shown (1 of 2)]
[im 1/62]
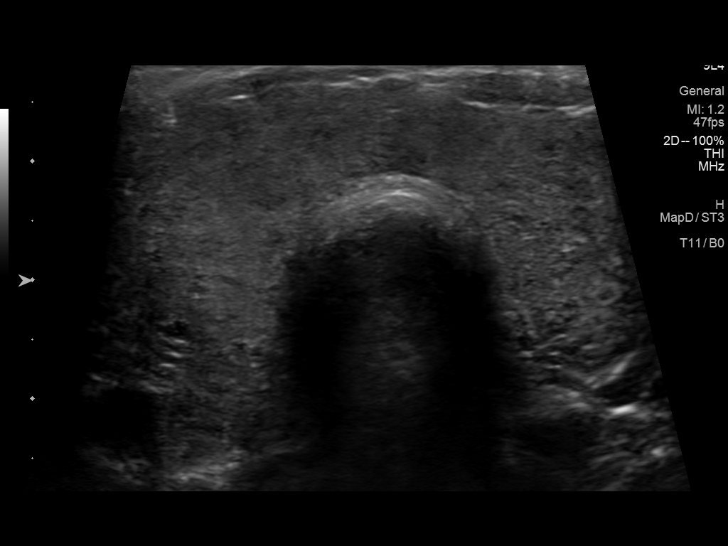
[im 6/62]
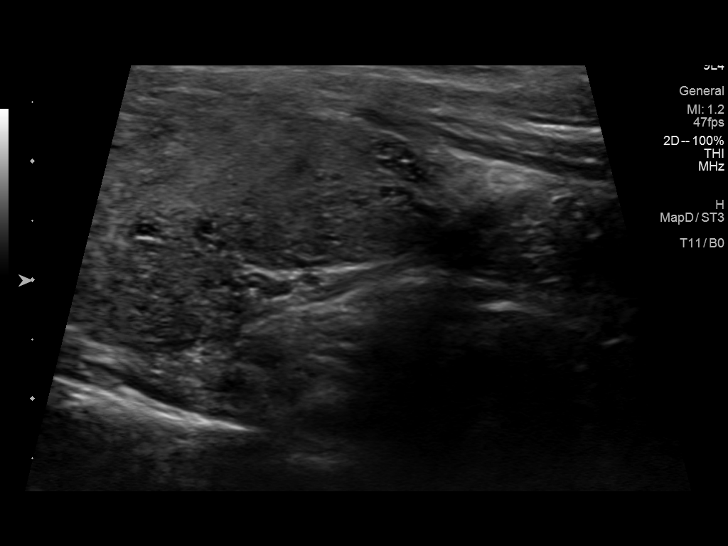
[im 11/62]
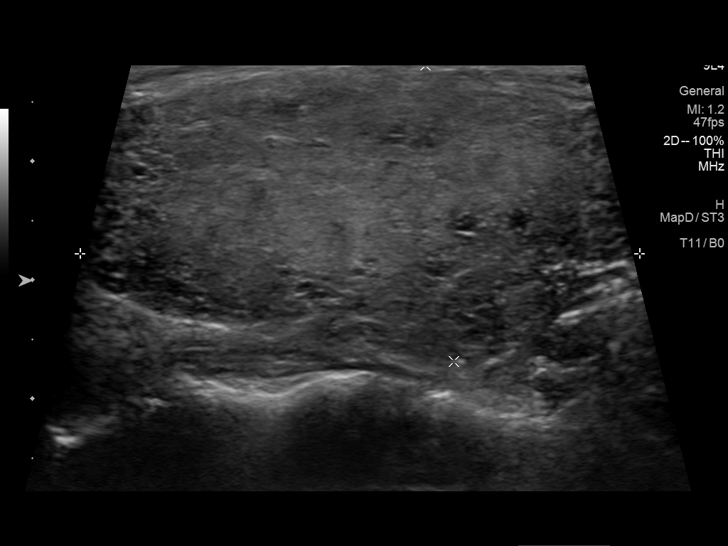
[im 16/62]
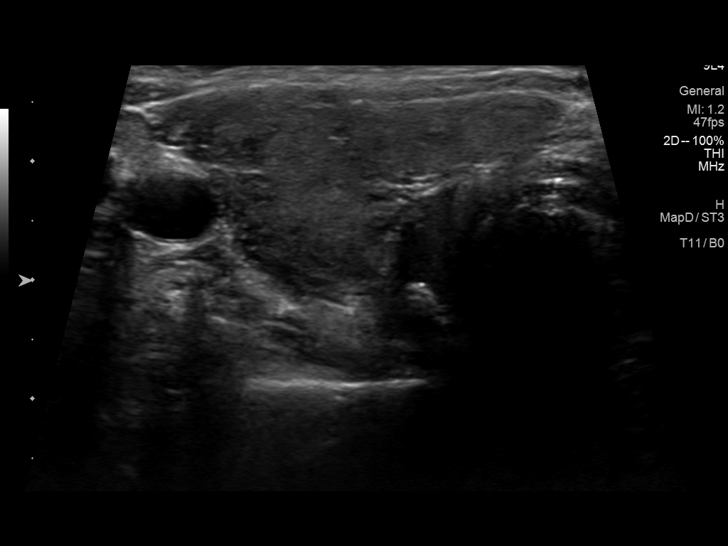
[im 22/62]
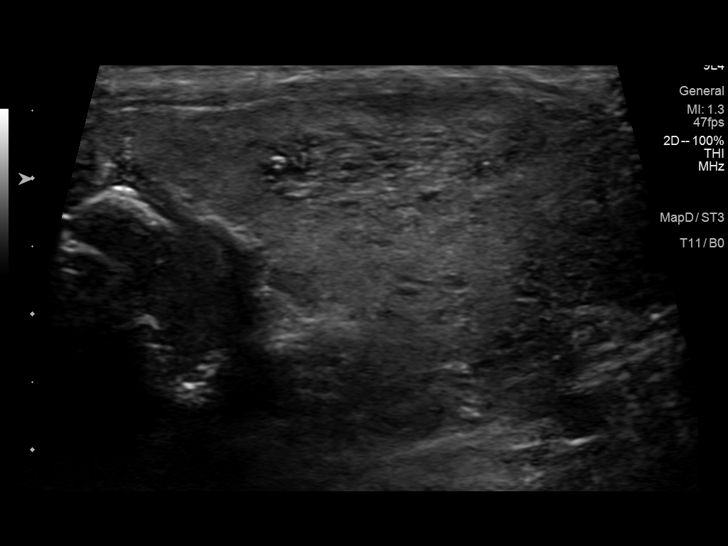
[im 27/62]
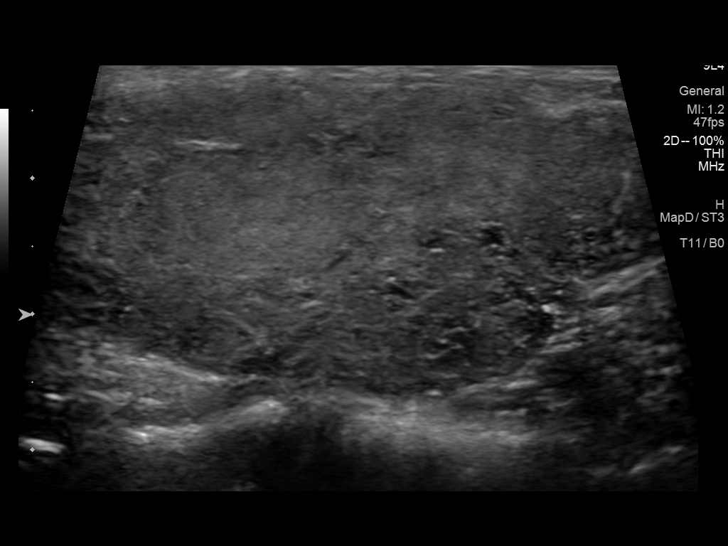
[im 32/62]
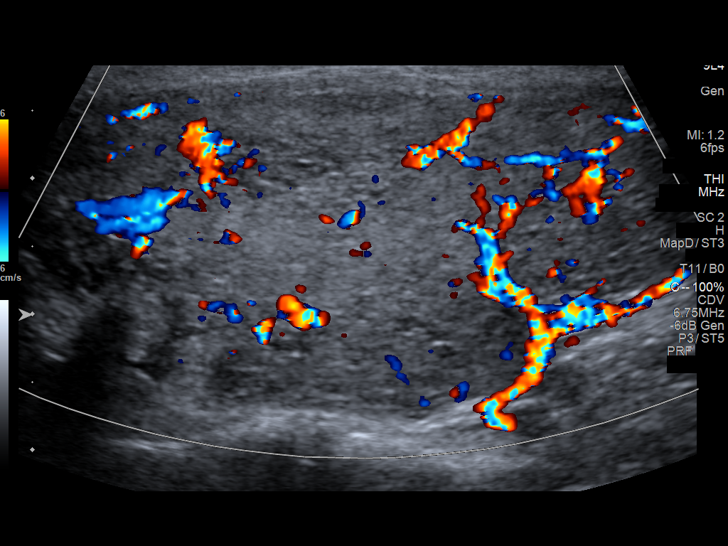
[im 38/62]
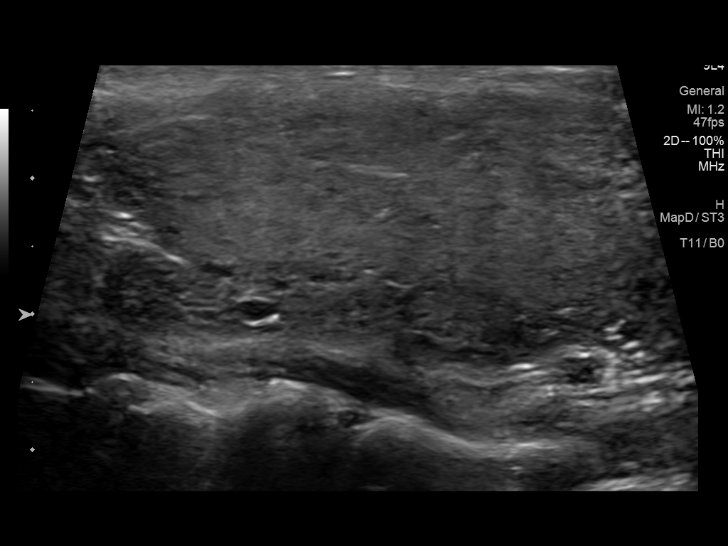
[im 43/62]
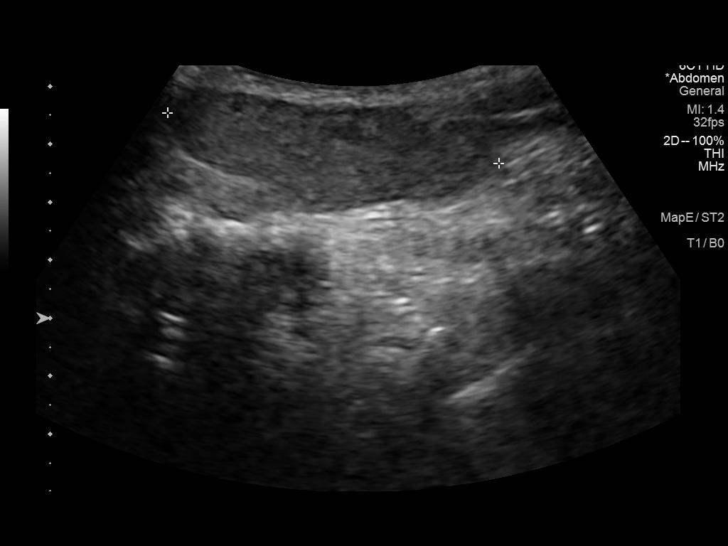
[im 48/62]
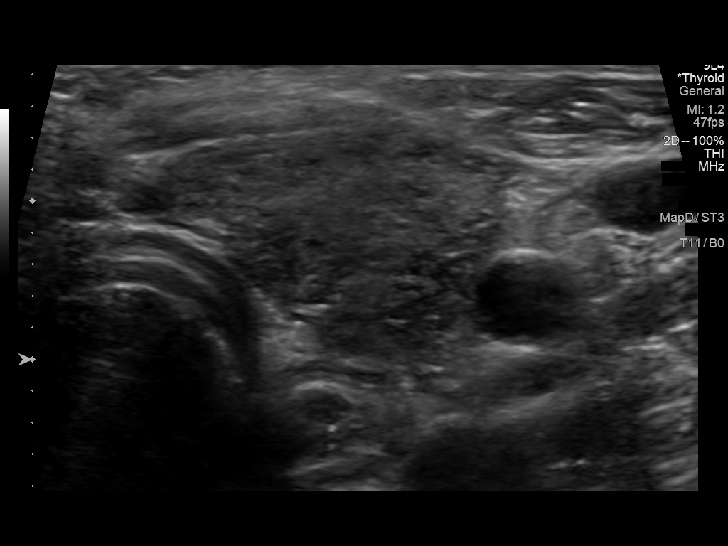
[im 54/62]
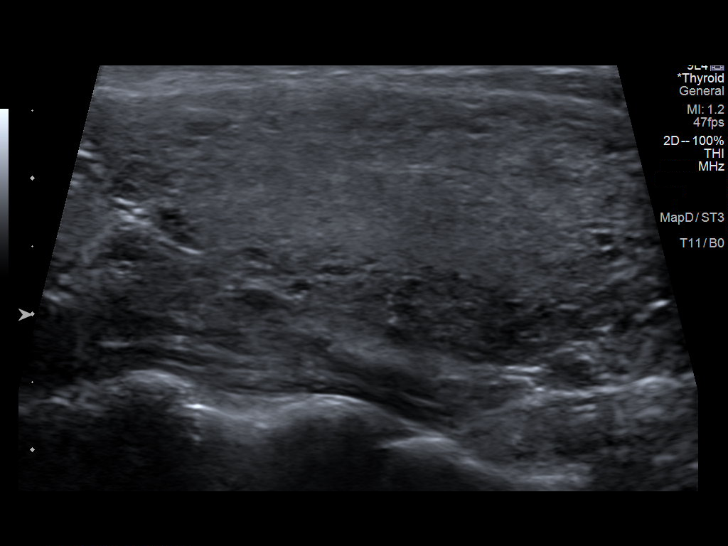
[im 59/62]
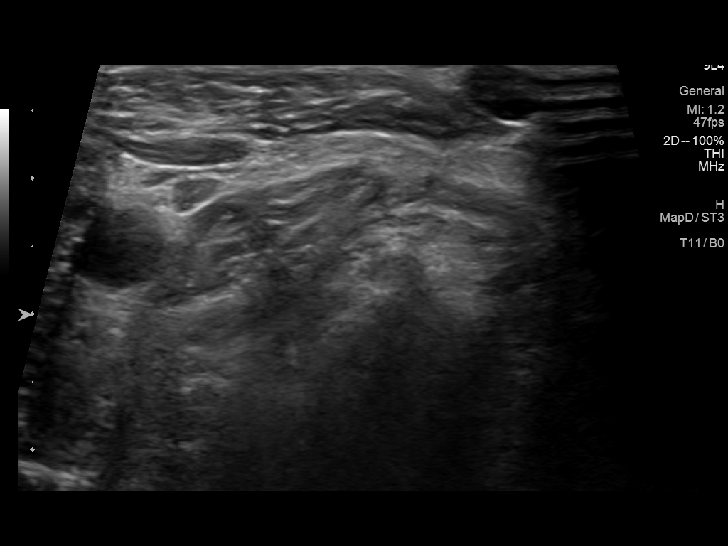

[Series 2001: us thyroid · 0.05mm/px · 1 of 1 slices shown (2 of 2)]
[im 1/1]
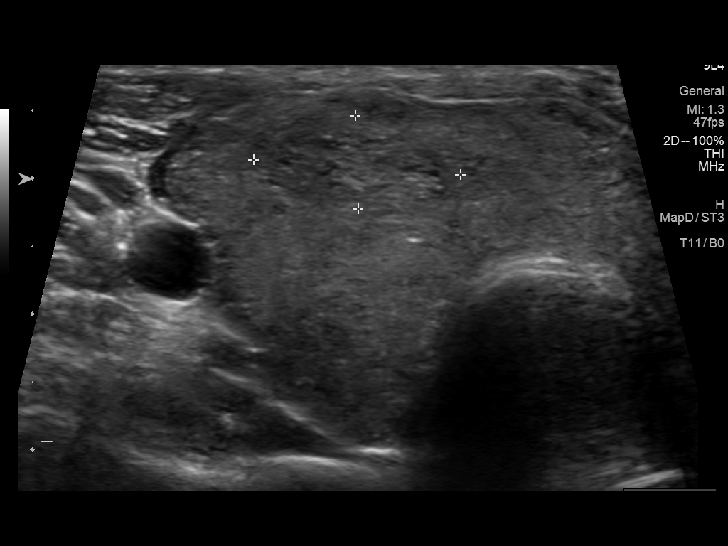

[13 of 25 positions shown; findings below may reference images not displayed]

FINDINGS: Parenchymal Echotexture: Moderately heterogenous

Isthmus: 0.8 cm

Right lobe: 5.6 x 2.5 x 2.7 cm

Left lobe: 5.8 x 2.5 x 2.5 cm

_________________________________________________________

Estimated total number of nodules >/= 1 cm: 1

Number of spongiform nodules >/=  2 cm not described below (TR1): 0

Number of mixed cystic and solid nodules >/= 1.5 cm not described
below (TR2): 0

_________________________________________________________

Nodule # 1:

Location: Right; Mid

Maximum size: 1.6 cm; Other 2 dimensions: 1.5 x 0.7 cm

Composition: solid/almost completely solid (2)

Echogenicity: isoechoic (1)

Shape: not taller-than-wide (0)

Margins: ill-defined (0)

Echogenic foci: none (0)

ACR TI-RADS total points: 3.

ACR TI-RADS risk category: TR3 (3 points).

ACR TI-RADS recommendations:

*Given size (>/= 1.5 - 2.4 cm) and appearance, a follow-up
ultrasound in 1 year should be considered based on TI-RADS criteria.

_________________________________________________________

Small spongiform nodule in the left mid gland does not meet criteria
for further evaluation.
IMPRESSION: 1. Diffusely enlarged and heterogeneous thyroid gland most
consistent with diffuse goitrous change.
2. There is a solitary questionable nodule versus pseudo nodule in
the right mid gland which measures up to 1.6 cm. This abnormality
meets criteria for surveillance. Recommend follow-up ultrasound in 1
year.

The above is in keeping with the ACR TI-RADS recommendations - [HOSPITAL] 4341;[DATE].

## 2020-11-29 ENCOUNTER — Telehealth: Payer: Self-pay | Admitting: Family Medicine

## 2020-11-29 ENCOUNTER — Other Ambulatory Visit: Payer: Self-pay | Admitting: Family Medicine

## 2020-11-29 MED ORDER — AMPHETAMINE-DEXTROAMPHETAMINE 10 MG PO TABS: 10.0000 mg | ORAL_TABLET | Freq: Every day | ORAL | 0 refills | Status: DC | PRN

## 2020-11-29 MED ORDER — AMPHETAMINE-DEXTROAMPHET ER 20 MG PO CP24: 20.0000 mg | ORAL_CAPSULE | Freq: Every day | ORAL | 0 refills | Status: DC

## 2020-11-29 NOTE — Telephone Encounter (Signed)
Let's keep the 20mg  XR and then increase the short to 10mg  daily first. I hate to increase further due to her elevated heart rate. She can let me know after this month if still feeling like she needs elevated dose. I would like to make sure she has seen endocrinology as well? Please get records if she has seen them or let me know if there is issue with her seeing them. Just want to make sure that thyroid is being addressed.

## 2020-11-29 NOTE — Telephone Encounter (Signed)
Patient informed of the message below.  Patient stated she was seen by Dr Tedd Sias, endo at [facility] and will contact their office to have records sent to Dr Hassan Rowan.

## 2020-11-29 NOTE — Telephone Encounter (Signed)
ALPRAZolam (XANAX) 0.5 MG tablet  amphetamine-dextroamphetamine (ADDERALL XR) 20 MG 24 hr capsule  amphetamine-dextroamphetamine (ADDERALL) 5 MG tablet - Patient wants to know if you can up this Rx back up to 20 MG. 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  [id number], [place] - [address] Phone:  [phone]  Fax:  [phone]

## 2020-12-10 ENCOUNTER — Other Ambulatory Visit: Payer: Self-pay | Admitting: Family Medicine

## 2020-12-10 DIAGNOSIS — F411 Generalized anxiety disorder: Secondary | ICD-10-CM

## 2020-12-22 ENCOUNTER — Encounter: Payer: 59 | Admitting: Family Medicine

## 2021-01-26 ENCOUNTER — Encounter: Payer: Self-pay | Admitting: Family Medicine

## 2021-01-27 ENCOUNTER — Other Ambulatory Visit: Payer: Self-pay | Admitting: Family Medicine

## 2021-01-27 DIAGNOSIS — F411 Generalized anxiety disorder: Secondary | ICD-10-CM

## 2021-01-29 MED ORDER — AMPHETAMINE-DEXTROAMPHETAMINE 10 MG PO TABS: 10.0000 mg | ORAL_TABLET | Freq: Every day | ORAL | 0 refills | Status: DC | PRN

## 2021-01-29 MED ORDER — AMPHETAMINE-DEXTROAMPHET ER 20 MG PO CP24: 20.0000 mg | ORAL_CAPSULE | Freq: Every day | ORAL | 0 refills | Status: DC

## 2021-01-29 MED ORDER — ALPRAZOLAM 0.5 MG PO TABS: 0.5000 mg | ORAL_TABLET | Freq: Every day | ORAL | 0 refills | Status: DC | PRN

## 2021-01-30 ENCOUNTER — Other Ambulatory Visit: Payer: Self-pay | Admitting: Family Medicine

## 2021-01-30 MED ORDER — SERTRALINE HCL 100 MG PO TABS: 100.0000 mg | ORAL_TABLET | Freq: Every day | ORAL | 1 refills | Status: DC

## 2021-03-09 ENCOUNTER — Encounter: Payer: Self-pay | Admitting: Family Medicine

## 2021-03-09 ENCOUNTER — Other Ambulatory Visit: Payer: Self-pay | Admitting: Family Medicine

## 2021-03-09 DIAGNOSIS — F411 Generalized anxiety disorder: Secondary | ICD-10-CM

## 2021-03-12 MED ORDER — AMPHETAMINE-DEXTROAMPHETAMINE 10 MG PO TABS: 10.0000 mg | ORAL_TABLET | Freq: Every day | ORAL | 0 refills | Status: DC | PRN

## 2021-03-12 MED ORDER — AMPHETAMINE-DEXTROAMPHET ER 20 MG PO CP24: 20.0000 mg | ORAL_CAPSULE | Freq: Every day | ORAL | 0 refills | Status: DC

## 2021-03-12 MED ORDER — ALPRAZOLAM 0.5 MG PO TABS: 0.5000 mg | ORAL_TABLET | Freq: Every day | ORAL | 0 refills | Status: DC | PRN

## 2021-04-25 ENCOUNTER — Encounter: Payer: Self-pay | Admitting: Physician Assistant

## 2021-04-25 ENCOUNTER — Telehealth: Payer: 59 | Admitting: Physician Assistant

## 2021-04-25 ENCOUNTER — Telehealth: Payer: Self-pay | Admitting: Family Medicine

## 2021-04-25 DIAGNOSIS — K047 Periapical abscess without sinus: Secondary | ICD-10-CM

## 2021-04-25 MED ORDER — AMOXICILLIN-POT CLAVULANATE 875-125 MG PO TABS: 1.0000 | ORAL_TABLET | Freq: Two times a day (BID) | ORAL | 0 refills | Status: DC

## 2021-04-25 NOTE — Patient Instructions (Signed)
Dental Pain Dental pain is often a sign that something is wrong with your teeth or gums. It is also something that can occur following dental treatment. If you have dental pain, it is important to contact your dental care provider, especially if the cause of the pain has not been determined. Dental pain may be of varying intensity and can be caused by many things, including:  Tooth decay (cavities or caries). Cavities are caused by bacteria that produce acids that irritate the nerve of your tooth, making it sensitive to air and hot or cold temperatures. This eventually causes discomfort or pain.  Abscess or infection. Once the bacteria reach the inner part of the tooth (pulp), a bacterial infection (dental abscess) can occur. Pus typically collects at the end of the root of a tooth.  Injury.  A crack in the tooth.  Gum recession exposing the root, and possibly the nerves, of a tooth.  Gum (periodontal)disease.  Abnormal grinding or clenching.  Poor or improper home care.  An unknown reason (idiopathic). Your pain may be mild or severe. It may occur when you are:  Chewing.  Exposed to hot or cold temperatures.  Eating or drinking sugary foods or beverages, such as soda or candy. Your pain may be constant, or it may come and go without cause. Follow these instructions at home: The following actions may help to lessen any discomfort that you are feeling before or after getting dental care. Medicines  Take over-the-counter and prescription medicines only as told by your dental care provider.  If you were prescribed an antibiotic medicine, take it as told by your dental care provider. Do not stop taking the antibiotic even if you start to feel better. Eating and drinking Avoid foods or drinks that cause you pain, such as:  Very hot or very cold foods or drinks.  Sweet or sugary foods or drinks. Managing pain and swelling  Ice can sometimes be used to reduce pain and swelling,  especially if the pain is following dental treatment.  If directed, put ice on the painful area of your face. To do this: ? Put ice in a plastic bag. ? Place a towel between your skin and the bag. ? Leave the ice on for 20 minutes, 2-3 times a day. ? Remove the ice if your skin turns bright red. This is very important. If you cannot feel pain, heat, or cold, you have a greater risk of damage to the area.   Brushing your teeth  To keep your mouth and gums healthy, brush your teeth twice a day using a fluoride toothpaste.  Use a toothpaste made for sensitive teeth as directed by your dental care provider, especially if the root is exposed.  Always brush your teeth with a soft-bristled toothbrush. This will help prevent irritation to your gums. General instructions  Floss at least once a day.  Do not apply heat to the outside of the face.  Gargle with a mixture of salt and water 3-4 times a day or as needed. To make salt water, completely dissolve -1 tsp (3-6 g) of salt in 1 cup (237 mL) of warm water.  Keep all follow-up visits. This is important. Contact a dental care provider if:  You have any unexplained dental pain.  Your pain is not controlled with medicines.  Your symptoms get worse.  You have new symptoms. Get help right away if:  You are unable to open your mouth.  You are having trouble breathing or   swallowing.  You have a fever.  You notice that your face, neck, or jaw is swollen. These symptoms may represent a serious problem that is an emergency. Do not wait to see if the symptoms will go away. Get medical help right away. Call your local emergency services (911 in the [place] Do not drive yourself to the hospital. Summary  Dental pain may be caused by many things, including tooth decay and infection.  Your pain may be mild or severe.  Take over-the-counter and prescription medicines only as told by your dental care provider.  Watch your dental pain for any  changes. Let your dental care provider know if your symptoms get worse. This information is not intended to replace advice given to you by your health care provider. Make sure you discuss any questions you have with your health care provider. Document Revised: 08/30/2020 Document Reviewed: 08/30/2020 Elsevier Patient Education  2021 Elsevier Inc.   Amoxicillin; Clavulanic Acid Tablets What is this medicine? AMOXICILLIN; CLAVULANIC ACID (a mox i SIL in; KLAV yoo lan ic AS id) is a penicillin antibiotic. It treats some infections caused by bacteria. It will not work for colds, the flu, or other viruses. This medicine may be used for other purposes; ask your health care provider or pharmacist if you have questions. COMMON BRAND NAME(S): Augmentin What should I tell my health care provider before I take this medicine? They need to know if you have any of these conditions:  kidney disease  liver disease  mononucleosis  stomach or intestine problems such as colitis  an unusual or allergic reaction to amoxicillin, other penicillin or cephalosporin antibiotics, clavulanic acid, other medicines, foods, dyes, or preservatives  pregnant or trying to get pregnant  breast-feeding How should I use this medicine? Take this drug by mouth. Take it as directed on the prescription label at the same time every day. Take it with food at the start of a meal or snack. Take all of this drug unless your health care provider tells you to stop it early. Keep taking it even if you think you are better. Talk to your health care provider about the use of this drug in children. While it may be prescribed for selected conditions, precautions do apply. Overdosage: If you think you have taken too much of this medicine contact a poison control center or emergency room at once. NOTE: This medicine is only for you. Do not share this medicine with others. What if I miss a dose? If you miss a dose, take it as soon as you  can. If it is almost time for your next dose, take only that dose. Do not take double or extra doses. What may interact with this medicine?  allopurinol  anticoagulants  birth control pills  methotrexate  probenecid This list may not describe all possible interactions. Give your health care provider a list of all the medicines, herbs, non-prescription drugs, or dietary supplements you use. Also tell them if you smoke, drink alcohol, or use illegal drugs. Some items may interact with your medicine. What should I watch for while using this medicine? Tell your health care provider if your symptoms do not start to get better or if they get worse. This medicine may cause serious skin reactions. They can happen weeks to months after starting the medicine. Contact your health care provider right away if you notice fevers or flu-like symptoms with a rash. The rash may be red or purple and then turn into blisters or  peeling of the skin. Or, you might notice a red rash with swelling of the face, lips or lymph nodes in your neck or under your arms. Do not treat diarrhea with over the counter products. Contact your health care provider if you have diarrhea that lasts more than 2 days or if it is severe and watery. If you have diabetes, you may get a false-positive result for sugar in your urine. Check with your health care provider. Birth control may not work properly while you are taking this medicine. Talk to your health care provider about using an extra method of birth control. What side effects may I notice from receiving this medicine? Side effects that you should report to your doctor or health care provider as soon as possible:  allergic reactions (skin rash, itching or hives; swelling of the face, lips, or tongue)  bloody or watery diarrhea  dark urine  infection (fever, chills, cough, sore throat, or pain)  kidney injury (trouble passing urine or change in the amount of urine)  redness,  blistering, peeling, or loosening of the skin, including inside the mouth  seizures  thrush (white patches in the mouth or mouth sores)  trouble breathing  unusual bruising or bleeding  unusually weak or tired Side effects that usually do not require medical attention (report to your doctor or health care provider if they continue or are bothersome):  diarrhea  dizziness  headache  nausea, vomiting  unusual vaginal discharge, itching, or odor  upset stomach This list may not describe all possible side effects. Call your doctor for medical advice about side effects. You may report side effects to FDA at 1-800-FDA-1088. Where should I keep my medicine? Keep out of the reach of children and pets. Store at room temperature between 20 and 25 degrees C (68 and 77 degrees F). Throw away any unused drug after the expiration date. NOTE: This sheet is a summary. It may not cover all possible information. If you have questions about this medicine, talk to your doctor, pharmacist, or health care provider.  2021 Elsevier/Gold Standard (2020-10-18 09:45:03)

## 2021-04-25 NOTE — Progress Notes (Signed)
Ms. kamoria, lucien are scheduled for a virtual visit with your provider today.    Just as we do with appointments in the office, we must obtain your consent to participate.  Your consent will be active for this visit and any virtual visit you may have with one of our providers in the next 365 days.    If you have a MyChart account, I can also send a copy of this consent to you electronically.  All virtual visits are billed to your insurance company just like a traditional visit in the office.  As this is a virtual visit, video technology does not allow for your provider to perform a traditional examination.  This may limit your provider's ability to fully assess your condition.  If your provider identifies any concerns that need to be evaluated in person or the need to arrange testing such as labs, EKG, etc, we will make arrangements to do so.    Although advances in technology are sophisticated, we cannot ensure that it will always work on either your end or our end.  If the connection with a video visit is poor, we may have to switch to a telephone visit.  With either a video or telephone visit, we are not always able to ensure that we have a secure connection.   I need to obtain your verbal consent now.   Are you willing to proceed with your visit today?   Lynsey Ange Fleisher has provided verbal consent on 04/25/2021 for a virtual visit (video or telephone).   Margaretann Loveless, PA-C 04/25/2021  7:09 PM    MyChart Video Visit    Virtual Visit via Video Note   This visit type was conducted due to national recommendations for restrictions regarding the COVID-19 Pandemic (e.g. social distancing) in an effort to limit this patient's exposure and mitigate transmission in our community. This patient is at least at moderate risk for complications without adequate follow up. This format is felt to be most appropriate for this patient at this time. Physical exam was limited by quality of the video and  audio technology used for the visit.   Patient location: Was driving but was asked to pull over for safety, patient agreed and pulled over and parked Provider location: Home office in [place]  I discussed the limitations of evaluation and management by telemedicine and the availability of in person appointments. The patient expressed understanding and agreed to proceed.  Patient: Leah Perez   DOB: Sep 18, 1991   30 y.o. Female  MRN: [id number] Visit Date: 04/25/2021  Today's healthcare provider: Margaretann Loveless, PA-C   No chief complaint on file.  Subjective    Dental Pain  This is a new problem. The current episode started in the past 7 days (pain and swelling noted over right upper back molar per patient). The problem occurs constantly. The problem has been gradually worsening. The pain is at a severity of 7/10. The pain is moderate. Associated symptoms include facial pain and thermal sensitivity. Pertinent negatives include no difficulty swallowing, fever, oral bleeding or sinus pressure. She has tried ice, NSAIDs, heat, rest and acetaminophen (Ibuprofen 800mg  every 4 hours, tylenol did not help, oragel) for the symptoms. The treatment provided mild relief.     Patient Active Problem List   Diagnosis Date Noted  . ADHD (attention deficit hyperactivity disorder) 10/08/2012  . History of migraine headaches 08/07/2011  . Anxiety state 03/06/2009  . DEPRESSION 03/06/2009   Past Medical History:  Diagnosis Date  . ANXIETY 03/06/2009  . DEPRESSION 03/06/2009  . Depression   . Migraine   . NSVD (normal spontaneous vaginal delivery) 09/12/2015  . PROM (premature rupture of membranes) 09/09/2015  . Rh negative status during pregnancy in second trimester, antepartum 05/31/2015   [ ] Rhophylac       Medications: Outpatient Medications Prior to Visit  Medication Sig  . ALPRAZolam (XANAX) 0.5 MG tablet Take 1 tablet (0.5 mg total) by mouth daily as needed for anxiety.   amphetamine-dextroamphetamine (ADDERALL XR) 20 MG 24 hr capsule Take 1 capsule (20 mg total) by mouth daily.  Marland Kitchen amphetamine-dextroamphetamine (ADDERALL) 10 MG tablet Take 1 tablet (10 mg total) by mouth daily as needed (in afternoon).  Marland Kitchen etonogestrel (NEXPLANON) 68 MG IMPL implant 1 each (68 mg total) by Subdermal route once for 1 dose.  . ibuprofen (ADVIL,MOTRIN) 600 MG tablet Take 1 tablet (600 mg total) by mouth every 6 (six) hours as needed.  . sertraline (ZOLOFT) 100 MG tablet Take 1 tablet (100 mg total) by mouth daily.   No facility-administered medications prior to visit.    Review of Systems  Constitutional: Negative for chills and fever.  HENT: Positive for dental problem and facial swelling. Negative for sinus pressure and trouble swallowing.   Gastrointestinal: Negative for abdominal pain, nausea and vomiting.    Last CBC Lab Results  Component Value Date   WBC 7.3 05/17/2020   HGB 13.5 05/17/2020   HCT 39.7 05/17/2020   MCV 87.1 05/17/2020   MCH 30.1 09/09/2015   RDW 13.5 05/17/2020   PLT 230.0 05/17/2020   Last metabolic panel Lab Results  Component Value Date   GLUCOSE 151 (H) 05/17/2020   NA 139 05/17/2020   K 4.2 05/17/2020   CL 105 05/17/2020   CO2 28 05/17/2020   BUN 16 05/17/2020   CREATININE 0.47 05/17/2020   GFRNONAA >60 04/07/2015   GFRAA >60 04/07/2015   CALCIUM 8.9 05/17/2020   PROT 6.6 05/17/2020   ALBUMIN 4.0 05/17/2020   BILITOT 0.3 05/17/2020   ALKPHOS 122 (H) 05/17/2020   AST 14 05/17/2020   ALT 20 05/17/2020   ANIONGAP 7 04/07/2015      Objective    There were no vitals taken for this visit. BP Readings from Last 3 Encounters:  06/21/20 (!) 110/58  05/17/20 128/70  06/11/18 136/72   Wt Readings from Last 3 Encounters:  10/04/20 138 lb 14.4 oz (63 kg)  06/21/20 138 lb 9.6 oz (62.9 kg)  05/17/20 142 lb 3.2 oz (64.5 kg)      Physical Exam Vitals reviewed.  Constitutional:      Appearance: Normal appearance. She is  well-developed.  HENT:     Head: Normocephalic and atraumatic.     Mouth/Throat:     Comments: Patient frequently reaches up to touch right cheek as it is painful Pulmonary:     Effort: Pulmonary effort is normal. No respiratory distress.  Musculoskeletal:     Cervical back: Normal range of motion and neck supple.  Neurological:     Mental Status: She is alert.  Psychiatric:        Behavior: Behavior normal.        Thought Content: Thought content normal.        Judgment: Judgment normal.        Assessment & Plan     1. Tooth abscess - Continue Ibuprofen 800mg  - Ice as tolerated 15 minutes on; repeat 4-5 times per day -  Augmentin provided for possible abscess - Continue oragel PRN - Salt water gargles can help with pain and inflammation - Seek in-person evaluation, particularly a dentist, if symptoms do not improve or if symptoms worsen - amoxicillin-clavulanate (AUGMENTIN) 875-125 MG tablet; Take 1 tablet by mouth 2 (two) times daily.  Dispense: 20 tablet; Refill: 0   No follow-ups on file.     I discussed the assessment and treatment plan with the patient. The patient was provided an opportunity to ask questions and all were answered. The patient agreed with the plan and demonstrated an understanding of the instructions.   The patient was advised to call back or seek an in-person evaluation if the symptoms worsen or if the condition fails to improve as anticipated.  I provided 14 minutes of face-to-face time during this encounter via MyChart Video enabled encounter.   Reine Just [facility] [phone] (phone) [phone] (fax)  [facility]

## 2021-04-25 NOTE — Telephone Encounter (Signed)
Patient is calling and is requesting a refill for ALPRAZolam (XANAX) 0.5 MG tablet, amphetamine-dextroamphetamine (ADDERALL XR) 20 MG 24 hr capsule, amphetamine-dextroamphetamine (ADDERALL) 10 MG tablet and sertraline (ZOLOFT) 100 MG tablet to be sent to   [id number]ann Sheen, [place] - [address] [place] [address]  Phone:  [phone] Fax:  [phone]  CB is [phone]   Also patient stated that she is experiencing wisdom tooth pain and wanted to see if provider can call her in an antibiotic to the pharmacy, please advise. CB is [phone]

## 2021-04-26 ENCOUNTER — Other Ambulatory Visit: Payer: Self-pay | Admitting: Family Medicine

## 2021-04-26 DIAGNOSIS — F411 Generalized anxiety disorder: Secondary | ICD-10-CM

## 2021-04-26 MED ORDER — AMPHETAMINE-DEXTROAMPHETAMINE 10 MG PO TABS: 10.0000 mg | ORAL_TABLET | Freq: Every day | ORAL | 0 refills | Status: DC | PRN

## 2021-04-26 MED ORDER — AMPHETAMINE-DEXTROAMPHET ER 20 MG PO CP24: 20.0000 mg | ORAL_CAPSULE | Freq: Every day | ORAL | 0 refills | Status: DC

## 2021-04-26 MED ORDER — SERTRALINE HCL 100 MG PO TABS: 100.0000 mg | ORAL_TABLET | Freq: Every day | ORAL | 1 refills | Status: DC

## 2021-04-26 MED ORDER — ALPRAZOLAM 0.5 MG PO TABS: 0.5000 mg | ORAL_TABLET | Freq: Every day | ORAL | 0 refills | Status: DC | PRN

## 2021-04-26 NOTE — Telephone Encounter (Signed)
Refills sent. I cannot refill xanax again until she has a visit. She is due for visit/physical now; please help set up for her.

## 2021-04-26 NOTE — Telephone Encounter (Signed)
Patient informed of the message below.  Appt scheduled on 6/17 to arrive at 11:15am.

## 2021-05-16 ENCOUNTER — Other Ambulatory Visit: Payer: Self-pay | Admitting: Family Medicine

## 2021-05-16 DIAGNOSIS — E041 Nontoxic single thyroid nodule: Secondary | ICD-10-CM

## 2021-05-25 ENCOUNTER — Encounter: Payer: Self-pay | Admitting: Family Medicine

## 2021-05-25 ENCOUNTER — Other Ambulatory Visit: Payer: Self-pay

## 2021-05-25 ENCOUNTER — Other Ambulatory Visit (HOSPITAL_COMMUNITY)
Admission: RE | Admit: 2021-05-25 | Discharge: 2021-05-25 | Disposition: A | Discharge status: Home / Self Care | Payer: 59 | Source: Ambulatory Visit | Attending: Family Medicine | Admitting: Family Medicine

## 2021-05-25 ENCOUNTER — Ambulatory Visit (INDEPENDENT_AMBULATORY_CARE_PROVIDER_SITE_OTHER): Payer: 59 | Admitting: Family Medicine

## 2021-05-25 VITALS — BP 120/60 | HR 120 | Temp 97.9°F | Ht 65.5 in | Wt 133.1 lb

## 2021-05-25 DIAGNOSIS — F900 Attention-deficit hyperactivity disorder, predominantly inattentive type: Secondary | ICD-10-CM

## 2021-05-25 DIAGNOSIS — F411 Generalized anxiety disorder: Secondary | ICD-10-CM

## 2021-05-25 DIAGNOSIS — Z131 Encounter for screening for diabetes mellitus: Secondary | ICD-10-CM | POA: Diagnosis not present

## 2021-05-25 DIAGNOSIS — Z0000 Encounter for general adult medical examination without abnormal findings: Secondary | ICD-10-CM | POA: Insufficient documentation

## 2021-05-25 DIAGNOSIS — Z13220 Encounter for screening for lipoid disorders: Secondary | ICD-10-CM

## 2021-05-25 DIAGNOSIS — E0590 Thyrotoxicosis, unspecified without thyrotoxic crisis or storm: Secondary | ICD-10-CM | POA: Diagnosis not present

## 2021-05-25 DIAGNOSIS — Z113 Encounter for screening for infections with a predominantly sexual mode of transmission: Secondary | ICD-10-CM | POA: Diagnosis present

## 2021-05-25 LAB — LIPID PANEL
Cholesterol: 127 mg/dL (ref 0–200)
HDL: 37.6 mg/dL — ABNORMAL LOW (ref >39.00)
LDL Cholesterol: 68 mg/dL (ref 0–99)
NonHDL: 89.17
Total CHOL/HDL Ratio: 3
Triglycerides: 107 mg/dL (ref 0.0–149.0)
VLDL: 21.4 mg/dL (ref 0.0–40.0)

## 2021-05-25 LAB — CBC WITH DIFFERENTIAL/PLATELET
Basophils Absolute: 0 10*3/uL (ref 0.0–0.1)
Basophils Relative: 0.6 % (ref 0.0–3.0)
Eosinophils Absolute: 0.1 10*3/uL (ref 0.0–0.7)
Eosinophils Relative: 2 % (ref 0.0–5.0)
HCT: 41.5 % (ref 36.0–46.0)
Hemoglobin: 14.3 g/dL (ref 12.0–15.0)
Lymphocytes Relative: 31.6 % (ref 12.0–46.0)
Lymphs Abs: 2.1 10*3/uL (ref 0.7–4.0)
MCHC: 34.5 g/dL (ref 30.0–36.0)
MCV: 85.2 fl (ref 78.0–100.0)
Monocytes Absolute: 0.8 10*3/uL (ref 0.1–1.0)
Monocytes Relative: 11.6 % (ref 3.0–12.0)
Neutro Abs: 3.5 10*3/uL (ref 1.4–7.7)
Neutrophils Relative %: 54.2 % (ref 43.0–77.0)
Platelets: 241 10*3/uL (ref 150.0–400.0)
RBC: 4.87 Mil/uL (ref 3.87–5.11)
RDW: 12.8 % (ref 11.5–15.5)
WBC: 6.5 10*3/uL (ref 4.0–10.5)

## 2021-05-25 LAB — T4, FREE: Free T4: 4.25 ng/dL — ABNORMAL HIGH (ref 0.60–1.60)

## 2021-05-25 LAB — TSH: TSH: <0.01 u[IU]/mL — ABNORMAL LOW (ref 0.35–4.50)

## 2021-05-25 LAB — COMPREHENSIVE METABOLIC PANEL
ALT: 25 U/L (ref 0–35)
AST: 18 U/L (ref 0–37)
Albumin: 4.1 g/dL (ref 3.5–5.2)
Alkaline Phosphatase: 94 U/L (ref 39–117)
BUN: 15 mg/dL (ref 6–23)
CO2: 27 mEq/L (ref 19–32)
Calcium: 9.2 mg/dL (ref 8.4–10.5)
Chloride: 106 mEq/L (ref 96–112)
Creatinine, Ser: 0.58 mg/dL (ref 0.40–1.20)
GFR: 121.47 mL/min (ref >60.00)
Glucose, Bld: 75 mg/dL (ref 70–99)
Potassium: 4.2 mEq/L (ref 3.5–5.1)
Sodium: 141 mEq/L (ref 135–145)
Total Bilirubin: 0.5 mg/dL (ref 0.2–1.2)
Total Protein: 6.9 g/dL (ref 6.0–8.3)

## 2021-05-25 MED ORDER — AMPHETAMINE-DEXTROAMPHETAMINE 10 MG PO TABS: 10.0000 mg | ORAL_TABLET | Freq: Every day | ORAL | 0 refills | Status: DC | PRN

## 2021-05-25 MED ORDER — ALPRAZOLAM 0.5 MG PO TABS: 0.5000 mg | ORAL_TABLET | Freq: Every day | ORAL | 0 refills | Status: DC | PRN

## 2021-05-25 MED ORDER — AMPHETAMINE-DEXTROAMPHET ER 30 MG PO CP24: 30.0000 mg | ORAL_CAPSULE | Freq: Every day | ORAL | 0 refills | Status: DC

## 2021-05-25 NOTE — Progress Notes (Signed)
Leah Perez DOB: 1991/03/28 Encounter date: 05/25/2021  This is a 30 y.o. female who presents with Chief Complaint  Patient presents with   Annual Exam    History of present illness:  Has been separated from spouse since February. Hard to manage being single mom. Very difficult. Financial stressor for her.   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 Yesterday saw two people overdose while out working/properties are affordable housing so some increased stress there.   Got new car. She is starting second job at [employer] next week so that she can work when she doesn't have son.   She is seeing endocrinology. She is on methimazole. She has had change in insurance (has [employer]BS again) so she can see someone closer. She has follow up US set up 6/27 with [facility].   Migraines: doing ok.   ADHD: very focused at work. Takes the 20mg  adderall xr in morning; takes the 10mg  at 2pm. Then when she gets home she is tired. Doesn't want to take later dose because needs to sleep.   Anxiety/stress is very high.    Allergies  Allergen Reactions   Doxycycline Hives and Rash   Current Meds  Medication Sig   ibuprofen (ADVIL,MOTRIN) 600 MG tablet Take 1 tablet (600 mg total) by mouth every 6 (six) hours as needed.   methimazole (TAPAZOLE) 10 MG tablet Take by mouth.   sertraline (ZOLOFT) 100 MG tablet Take 1 tablet (100 mg total) by mouth daily.   [DISCONTINUED] ALPRAZolam (XANAX) 0.5 MG tablet Take 1 tablet (0.5 mg total) by mouth daily as needed for anxiety.   [DISCONTINUED] amphetamine-dextroamphetamine (ADDERALL XR) 20 MG 24 hr capsule Take 1 capsule (20 mg total) by mouth daily.   [DISCONTINUED] amphetamine-dextroamphetamine (ADDERALL) 10 MG tablet Take 1 tablet (10 mg total) by mouth daily as needed (in afternoon).    Review of Systems  Constitutional:  Positive for fatigue (increased stress, less sleep). Negative for activity change, appetite  change, chills, fever and unexpected weight change.  HENT:  Negative for congestion, ear pain, hearing loss, sinus pressure, sinus pain, sore throat and trouble swallowing.   Eyes:  Negative for pain and visual disturbance.  Respiratory:  Negative for cough, chest tightness, shortness of breath and wheezing.   Cardiovascular:  Positive for palpitations. Negative for chest pain and leg swelling.  Gastrointestinal:  Negative for abdominal pain, blood in stool, constipation, diarrhea, nausea and vomiting.  Genitourinary:  Negative for difficulty urinating, menstrual problem and vaginal discharge.  Musculoskeletal:  Negative for arthralgias and back pain.  Skin:  Negative for rash.  Neurological:  Negative for dizziness, weakness, numbness and headaches.  Hematological:  Negative for adenopathy. Does not bruise/bleed easily.  Psychiatric/Behavioral:  Negative for sleep disturbance and suicidal ideas. The patient is not nervous/anxious.    Objective:  BP 120/60 (BP Location: Left Arm, Patient Position: Sitting, Cuff Size: Normal)   Pulse (!) 120   Temp 97.9 F (36.6 C) (Oral)   Ht 5' 5.5" (1.664 m)   Wt 133 lb 1.6 oz (60.4 kg)   SpO2 97%   BMI 21.81 kg/m   Weight: 133 lb 1.6 oz (60.4 kg)   BP Readings from Last 3 Encounters:  05/25/21 120/60  06/21/20 (!) 110/58  05/17/20 128/70   Wt Readings from Last 3 Encounters:  05/25/21 133 lb 1.6 oz (60.4 kg)  10/04/20 138 lb 14.4 oz (63 kg)  06/21/20 138 lb 9.6 oz (62.9  kg)    Physical Exam Constitutional:      General: She is not in acute distress.    Appearance: She is well-developed.  HENT:     Head: Normocephalic and atraumatic.     Right Ear: External ear normal.     Left Ear: External ear normal.     Mouth/Throat:     Pharynx: No oropharyngeal exudate.  Eyes:     Conjunctiva/sclera: Conjunctivae normal.     Pupils: Pupils are equal, round, and reactive to light.     Comments: proptosis  Neck:     Thyroid: No thyromegaly.   Cardiovascular:     Rate and Rhythm: Normal rate and regular rhythm.     Heart sounds: Normal heart sounds. No murmur heard.   No friction rub. No gallop.  Pulmonary:     Effort: Pulmonary effort is normal.     Breath sounds: Normal breath sounds.  Abdominal:     General: Bowel sounds are normal. There is no distension.     Palpations: Abdomen is soft. There is no mass.     Tenderness: There is no abdominal tenderness. There is no guarding.     Hernia: No hernia is present.  Genitourinary:    General: Normal vulva.     Exam position: Supine.     Urethra: No prolapse, urethral swelling or urethral lesion.     Vagina: Normal.     Cervix: Normal.     Uterus: Normal.      Adnexa: Right adnexa normal and left adnexa normal.     Rectum: Normal.  Musculoskeletal:        General: No tenderness or deformity. Normal range of motion.     Cervical back: Normal range of motion and neck supple.  Lymphadenopathy:     Cervical: No cervical adenopathy.  Skin:    General: Skin is warm and dry.     Findings: No rash.  Neurological:     Mental Status: She is alert and oriented to person, place, and time.     Deep Tendon Reflexes: Reflexes normal.     Reflex Scores:      Tricep reflexes are 2+ on the right side and 2+ on the left side.      Bicep reflexes are 2+ on the right side and 2+ on the left side.      Brachioradialis reflexes are 2+ on the right side and 2+ on the left side.      Patellar reflexes are 2+ on the right side and 2+ on the left side. Psychiatric:        Speech: Speech normal.        Behavior: Behavior normal.        Thought Content: Thought content normal.    Assessment/Plan 1. Preventative health care Discussed staying on track with healthy lifestyle - stress control, healthy eating, regular exercise.  - HIV Antibody (routine testing w rflx); Future - RPR; Future - Hepatitis B surface antigen; Future - PAP [[place]]  2. Hyperthyroidism Needs to follow up  with endocrinology. She will be switching fproviders now that her insurance has changed. She was limited to one provider that was remote location from her previously. She is taking methimazole currently.  - CBC with Differential/Platelet; Future - T4, free; Future - TSH; Future - Ambulatory referral to Endocrinology  3. Anxiety state She continues to have high stress, anxiety. Continue with zoloft 100mg ; xanax to use sparingly.  - ALPRAZolam (XANAX) 0.5 MG tablet;  Take 1 tablet (0.5 mg total) by mouth daily as needed for anxiety.  Dispense: 30 tablet; Refill: 0  4. Attention deficit hyperactivity disorder (ADHD), predominantly inattentive type She is getting some wear-off of medication early in afternoon that afternoon adderall 10mg  dose is not covering. We are going to switch to adderall 30mg  xr in morning to see if this gives additional benefit. Thyroid may also be playing role in anxiety/add as she remains hyperthyroid.  5. Lipid screening - Lipid panel; Future  6. Screening for diabetes mellitus - Comprehensive metabolic panel; Future   Return in about 3 months (around 08/25/2021) for Chronic condition visit.     , MD

## 2021-05-28 LAB — CERVICOVAGINAL ANCILLARY ONLY
Bacterial Vaginitis (gardnerella): POSITIVE — AB
Candida Glabrata: NEGATIVE
Candida Vaginitis: POSITIVE — AB
Chlamydia: NEGATIVE
Comment: NEGATIVE
Comment: NEGATIVE
Comment: NEGATIVE
Comment: NEGATIVE
Comment: NEGATIVE
Comment: NORMAL
Neisseria Gonorrhea: NEGATIVE
Trichomonas: NEGATIVE

## 2021-05-28 LAB — HEPATITIS B SURFACE ANTIGEN: Hepatitis B Surface Ag: NONREACTIVE

## 2021-05-28 LAB — HIV ANTIBODY (ROUTINE TESTING W REFLEX): HIV 1&2 Ab, 4th Generation: NONREACTIVE

## 2021-05-28 LAB — RPR: RPR Ser Ql: NONREACTIVE

## 2021-05-29 ENCOUNTER — Other Ambulatory Visit: Payer: Self-pay | Admitting: Family Medicine

## 2021-05-29 MED ORDER — FLUCONAZOLE 150 MG PO TABS: 150.0000 mg | ORAL_TABLET | Freq: Once | ORAL | 0 refills | Status: AC

## 2021-05-29 MED ORDER — METRONIDAZOLE 500 MG PO TABS: 500.0000 mg | ORAL_TABLET | Freq: Two times a day (BID) | ORAL | 0 refills | Status: AC

## 2021-05-31 LAB — CYTOLOGY - PAP
Comment: NEGATIVE
Comment: NEGATIVE
Diagnosis: NEGATIVE
HPV 16: NEGATIVE
HPV 18 / 45: NEGATIVE
High risk HPV: POSITIVE — AB

## 2021-06-04 ENCOUNTER — Other Ambulatory Visit: Payer: Medicaid Other

## 2021-07-09 ENCOUNTER — Other Ambulatory Visit: Payer: Self-pay | Admitting: Family Medicine

## 2021-07-09 DIAGNOSIS — F411 Generalized anxiety disorder: Secondary | ICD-10-CM

## 2021-07-10 MED ORDER — AMPHETAMINE-DEXTROAMPHET ER 30 MG PO CP24: 30.0000 mg | ORAL_CAPSULE | Freq: Every day | ORAL | 0 refills | Status: DC

## 2021-07-10 MED ORDER — ALPRAZOLAM 0.5 MG PO TABS: 0.5000 mg | ORAL_TABLET | Freq: Every day | ORAL | 0 refills | Status: DC | PRN

## 2021-07-10 MED ORDER — AMPHETAMINE-DEXTROAMPHETAMINE 10 MG PO TABS: 10.0000 mg | ORAL_TABLET | Freq: Every day | ORAL | 0 refills | Status: DC | PRN

## 2021-08-23 ENCOUNTER — Other Ambulatory Visit: Payer: Self-pay | Admitting: Family Medicine

## 2021-08-23 DIAGNOSIS — F411 Generalized anxiety disorder: Secondary | ICD-10-CM

## 2021-08-27 ENCOUNTER — Ambulatory Visit: Payer: PRIVATE HEALTH INSURANCE | Admitting: Family Medicine

## 2021-08-27 NOTE — Progress Notes (Deleted)
  Leah Perez DOB: [birth year] Encounter date: 08/27/2021  This is a 30 y.o. female who presents with No chief complaint on file.   History of present illness: Anxiety: ADHD: Hyperthyroidism:  HPI   Allergies  Allergen Reactions   Doxycycline Hives and Rash   No outpatient medications have been marked as taking for the 08/27/21 encounter (Appointment) with Wynn Banker, MD.    Review of Systems  Objective:  There were no vitals taken for this visit.      BP Readings from Last 3 Encounters:  05/25/21 120/60  06/21/20 (!) 110/58  05/17/20 128/70   Wt Readings from Last 3 Encounters:  05/25/21 133 lb 1.6 oz (60.4 kg)  10/04/20 138 lb 14.4 oz (63 kg)  06/21/20 138 lb 9.6 oz (62.9 kg)    Physical Exam  Assessment/Plan  There are no diagnoses linked to this encounter.       Theodis Shove, MD

## 2021-08-31 ENCOUNTER — Encounter: Payer: Self-pay | Admitting: Family Medicine

## 2021-09-05 ENCOUNTER — Ambulatory Visit: Payer: 59 | Admitting: Family Medicine

## 2021-09-07 ENCOUNTER — Other Ambulatory Visit: Payer: Self-pay | Admitting: Family Medicine

## 2021-09-07 MED ORDER — AMPHETAMINE-DEXTROAMPHETAMINE 10 MG PO TABS: 10.0000 mg | ORAL_TABLET | Freq: Every day | ORAL | 0 refills | Status: DC | PRN

## 2021-09-07 MED ORDER — AMPHETAMINE-DEXTROAMPHET ER 30 MG PO CP24: 30.0000 mg | ORAL_CAPSULE | Freq: Every day | ORAL | 0 refills | Status: DC

## 2021-09-07 MED ORDER — SERTRALINE HCL 100 MG PO TABS: 100.0000 mg | ORAL_TABLET | Freq: Every day | ORAL | 0 refills | Status: DC

## 2021-09-14 ENCOUNTER — Other Ambulatory Visit: Payer: Self-pay | Admitting: Family Medicine

## 2021-09-14 ENCOUNTER — Telehealth: Payer: 59 | Admitting: Family Medicine

## 2021-09-14 DIAGNOSIS — E0590 Thyrotoxicosis, unspecified without thyrotoxic crisis or storm: Secondary | ICD-10-CM

## 2021-09-14 DIAGNOSIS — L989 Disorder of the skin and subcutaneous tissue, unspecified: Secondary | ICD-10-CM

## 2021-09-14 DIAGNOSIS — L709 Acne, unspecified: Secondary | ICD-10-CM

## 2021-09-14 DIAGNOSIS — F411 Generalized anxiety disorder: Secondary | ICD-10-CM

## 2021-09-14 DIAGNOSIS — F900 Attention-deficit hyperactivity disorder, predominantly inattentive type: Secondary | ICD-10-CM

## 2021-09-14 MED ORDER — TRETINOIN MICROSPHERE 0.1 % EX GEL: Freq: Every day | CUTANEOUS | 2 refills | Status: DC

## 2021-09-14 MED ORDER — TRETINOIN 0.025 % EX CREA: TOPICAL_CREAM | Freq: Every day | CUTANEOUS | 2 refills | Status: DC

## 2021-09-14 MED ORDER — SERTRALINE HCL 25 MG PO TABS: 25.0000 mg | ORAL_TABLET | Freq: Every day | ORAL | Status: DC

## 2021-09-14 MED ORDER — ALPRAZOLAM 0.5 MG PO TABS: 0.5000 mg | ORAL_TABLET | Freq: Every day | ORAL | 0 refills | Status: DC | PRN
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
## 2021-10-10 ENCOUNTER — Other Ambulatory Visit: Payer: Self-pay | Admitting: Family Medicine

## 2021-10-10 DIAGNOSIS — F411 Generalized anxiety disorder: Secondary | ICD-10-CM

## 2021-10-10 MED ORDER — SERTRALINE HCL 100 MG PO TABS: 100.0000 mg | ORAL_TABLET | Freq: Every day | ORAL | 0 refills | Status: DC

## 2021-10-10 MED ORDER — ALPRAZOLAM 0.5 MG PO TABS: 0.5000 mg | ORAL_TABLET | Freq: Every day | ORAL | 0 refills | Status: DC | PRN

## 2021-10-10 MED ORDER — AMPHETAMINE-DEXTROAMPHET ER 30 MG PO CP24: 30.0000 mg | ORAL_CAPSULE | Freq: Every day | ORAL | 0 refills | Status: DC

## 2021-10-10 MED ORDER — AMPHETAMINE-DEXTROAMPHETAMINE 10 MG PO TABS: 10.0000 mg | ORAL_TABLET | Freq: Every day | ORAL | 0 refills | Status: DC | PRN

## 2021-11-12 ENCOUNTER — Other Ambulatory Visit: Payer: Self-pay | Admitting: Family Medicine

## 2021-11-12 DIAGNOSIS — F411 Generalized anxiety disorder: Secondary | ICD-10-CM

## 2021-11-12 MED ORDER — SERTRALINE HCL 100 MG PO TABS: 100.0000 mg | ORAL_TABLET | Freq: Every day | ORAL | 0 refills | Status: DC

## 2021-11-12 MED ORDER — SERTRALINE HCL 25 MG PO TABS: 25.0000 mg | ORAL_TABLET | Freq: Every day | ORAL | 0 refills | Status: DC

## 2021-11-13 ENCOUNTER — Encounter: Payer: Self-pay | Admitting: Family Medicine

## 2021-11-14 ENCOUNTER — Other Ambulatory Visit: Payer: Self-pay | Admitting: Family Medicine

## 2021-11-14 DIAGNOSIS — F411 Generalized anxiety disorder: Secondary | ICD-10-CM

## 2021-11-14 MED ORDER — AMPHETAMINE-DEXTROAMPHET ER 30 MG PO CP24: 30.0000 mg | ORAL_CAPSULE | Freq: Every day | ORAL | 0 refills | Status: DC

## 2021-11-14 MED ORDER — AMPHETAMINE-DEXTROAMPHETAMINE 10 MG PO TABS: 10.0000 mg | ORAL_TABLET | Freq: Every day | ORAL | 0 refills | Status: DC | PRN

## 2021-11-14 MED ORDER — ALPRAZOLAM 0.5 MG PO TABS: 0.5000 mg | ORAL_TABLET | Freq: Every day | ORAL | 0 refills | Status: DC | PRN

## 2021-11-27 ENCOUNTER — Encounter: Payer: Self-pay | Admitting: Family Medicine

## 2021-11-27 ENCOUNTER — Telehealth (INDEPENDENT_AMBULATORY_CARE_PROVIDER_SITE_OTHER): Payer: 59 | Admitting: Family Medicine

## 2021-11-27 VITALS — Temp 98.8°F

## 2021-11-27 DIAGNOSIS — R0981 Nasal congestion: Secondary | ICD-10-CM | POA: Diagnosis not present

## 2021-11-27 DIAGNOSIS — R519 Headache, unspecified: Secondary | ICD-10-CM

## 2021-11-27 DIAGNOSIS — R059 Cough, unspecified: Secondary | ICD-10-CM

## 2021-11-27 MED ORDER — BENZONATATE 100 MG PO CAPS: ORAL_CAPSULE | ORAL | 0 refills | Status: DC

## 2021-11-27 MED ORDER — AMOXICILLIN-POT CLAVULANATE 875-125 MG PO TABS: 1.0000 | ORAL_TABLET | Freq: Two times a day (BID) | ORAL | 0 refills | Status: DC

## 2021-11-27 NOTE — Patient Instructions (Signed)
-  I sent the medication(s) we discussed to your pharmacy: Meds ordered this encounter  Medications   amoxicillin-clavulanate (AUGMENTIN) 875-125 MG tablet    Sig: Take 1 tablet by mouth 2 (two) times daily.    Dispense:  20 tablet    Refill:  0   benzonatate (TESSALON PERLES) 100 MG capsule    Sig: 1-2 capsules up to twice daily as needed for cough    Dispense:  30 capsule    Refill:  0     I hope you are feeling better soon!  Seek in person care promptly if your symptoms worsen, new concerns arise or you are not improving with treatment.  It was nice to meet you today. I help [place] out with telemedicine visits on Tuesdays and Thursdays and am happy to help if you need a virtual follow up visit on those days. Otherwise, if you have any concerns or questions following this visit please schedule a follow up visit with your Primary Care office or seek care at a local urgent care clinic to avoid delays in care

## 2021-11-27 NOTE — Progress Notes (Signed)
Virtual Visit via Video Note  I connected with Leah Perez  on 11/27/21 at  3:40 PM EST by a video enabled telemedicine application and verified that I am speaking with the correct person using two identifiers.  Location patient: home, [place] Location provider:work or home office Persons participating in the virtual visit: patient, provider  I discussed the limitations of evaluation and management by telemedicine and the availability of in person appointments. The patient expressed understanding and agreed to proceed.   HPI:  Acute telemedicine visit for sinus issues: -Onset: about 2 weeks -Symptoms include: nasal congestion, cough, pnd, yellow mucus - wonders if it is a sinus infection, now with some sinus pain -3 negative covid tests -Denies:  fevers, CP, SOB, wheezing -Pertinent past medical history: see below -Pertinent medication allergies:  Allergies  Allergen Reactions   Doxycycline Hives and Rash  -COVID-19 vaccine status:  Immunization History  Administered Date(s) Administered   Influenza Split 10/25/2011   Influenza,inj,Quad PF,6+ Mos 08/10/2015   Tdap 06/27/2015   ROS: See pertinent positives and negatives per HPI.  Past Medical History:  Diagnosis Date   ANXIETY 03/06/2009   DEPRESSION 03/06/2009   Depression    Migraine    NSVD (normal spontaneous vaginal delivery) 09/12/2015   PROM (premature rupture of membranes) 09/09/2015   Rh negative status during pregnancy in second trimester, antepartum 05/31/2015   [ ] Rhophylac     Past Surgical History:  Procedure Laterality Date   WISDOM TOOTH EXTRACTION       Current Outpatient Medications:    ALPRAZolam (XANAX) 0.5 MG tablet, Take 1 tablet (0.5 mg total) by mouth daily as needed for anxiety., Disp: 30 tablet, Rfl: 0   amoxicillin-clavulanate (AUGMENTIN) 875-125 MG tablet, Take 1 tablet by mouth 2 (two) times daily., Disp: 20 tablet, Rfl: 0   amphetamine-dextroamphetamine (ADDERALL XR) 30 MG 24 hr capsule, Take 1  capsule (30 mg total) by mouth daily., Disp: 30 capsule, Rfl: 0   amphetamine-dextroamphetamine (ADDERALL) 10 MG tablet, Take 1 tablet (10 mg total) by mouth daily as needed (in afternoon)., Disp: 30 tablet, Rfl: 0   benzonatate (TESSALON PERLES) 100 MG capsule, 1-2 capsules up to twice daily as needed for cough, Disp: 30 capsule, Rfl: 0   ibuprofen (ADVIL,MOTRIN) 600 MG tablet, Take 1 tablet (600 mg total) by mouth every 6 (six) hours as needed., Disp: 30 tablet, Rfl: 0   sertraline (ZOLOFT) 100 MG tablet, Take 1 tablet (100 mg total) by mouth daily., Disp: 90 tablet, Rfl: 0   sertraline (ZOLOFT) 25 MG tablet, Take 1 tablet (25 mg total) by mouth daily., Disp: 90 tablet, Rfl: 0   tretinoin (RETIN-A) 0.025 % cream, Apply topically at bedtime., Disp: 45 g, Rfl: 2   etonogestrel (NEXPLANON) 68 MG IMPL implant, 1 each (68 mg total) by Subdermal route once for 1 dose., Disp: 1 each, Rfl: 0   methimazole (TAPAZOLE) 10 MG tablet, Take by mouth., Disp: , Rfl:   EXAM:  VITALS per patient if applicable:  GENERAL: alert, oriented, appears well and in no acute distress  HEENT: atraumatic, conjunttiva clear, no obvious abnormalities on inspection of external nose and ears  NECK: normal movements of the head and neck  LUNGS: on inspection no signs of respiratory distress, breathing rate appears normal, no obvious gross SOB, gasping or wheezing  CV: no obvious cyanosis  MS: moves all visible extremities without noticeable abnormality  PSYCH/NEURO: pleasant and cooperative, no obvious depression or anxiety, speech and thought processing grossly intact  ASSESSMENT  AND PLAN:  Discussed the following assessment and plan:  Nasal sinus congestion  Facial pain  Cough, unspecified type  -we discussed possible serious and likely etiologies, options for evaluation and workup, limitations of telemedicine visit vs in person visit, treatment, treatment risks and precautions. Pt is agreeable to treatment  via telemedicine at this moment. Query sinusitis vs other. She wants to try empiric abx for possible bacterial sinusitis. Reasonable given duration of symptoms and worsening. Also sent tessalon for cough and advised nasal saline.  Advised to seek prompt virtual visit follow up or in person care if worsening, new symptoms arise, or if is not improving with treatment.  I discussed the assessment and treatment plan with the patient. The patient was provided an opportunity to ask questions and all were answered. The patient agreed with the plan and demonstrated an understanding of the instructions.     Leah Koyanagi, DO

## 2021-12-13 NOTE — Progress Notes (Addendum)
Patient ID: Leah Perez, female   DOB: [birth year], 31 y.o.   MRN: [id number]  This visit occurred during the SARS-CoV-2 public health emergency.  Safety protocols were in place, including screening questions prior to the visit, additional usage of staff PPE, and extensive cleaning of exam room while observing appropriate contact time as indicated for disinfecting solutions.    HPI  Leah RiverDanielle S Brightbill is a 31 y.o.-year-old female, referred by her PCP, Dr. Hassan RowanKoberlein, for evaluation and management of thyrotoxicosis and thyroid nodules.  Patient mentions that she noticed that her eyes were bulging after the birth of her son in 2016.  She was found to be thyrotoxic in 2019 and saw endocrinology (Dr. Tedd SiasSolum). She was started on MMI 10 mg daily and reportedly her TFTs were controlled until 2021 when she could not return to see Dr. Tedd SiasSolum due to a change in insurance.  I was able to review thyroid tests from 2021 and 2022 in [facility] and her TSH was undetectable while free thyroid hormones were high and she mentions that at that time she was off methimazole as she could not refill the medication.  She remains off methimazole now.  She initially felt better on methimazole, with less fatigue and better concentration.  However, after coming off methimazole, she started to have weight loss (~4-6 sizes), tremors, palpitations, heat intolerance, hair loss, hyper defecation and loss of concentration (Adderall dose was increased).  Also, she has a history of HTN, swelling in the legs in last few years, and feels her teeth are "crumbling".  I reviewed pt's thyroid tests: Lab Results  Component Value Date   TSH <0.01 (L) 05/25/2021   TSH <0.01 (L) 05/17/2020   FREET4 4.25 (H) 05/25/2021   FREET4 3.85 (H) 05/17/2020   T3FREE 10.3 (H) 05/17/2020   Also, per review of records from [facility]: 01/23/2021: TSH <0.01, free T4 3.32,  10/27/2020: TSH <0.01, free T4 3.89 (0.66-1.14), total T3 407 (87-178) 03/08/2010:  TSH 1.26  Antithyroid antibodies: No results found for: TSI  Thyroid ultrasound on 05/26/2020 and this showed 2 small nodules: Parenchymal Echotexture: Moderately heterogenous Isthmus: 0.8 cm Right lobe: 5.6 x 2.5 x 2.7 cm Left lobe: 5.8 x 2.5 x 2.5 cm ______________________________________________ Nodule # 1: Location: Right; Mid  Maximum size: 1.6 cm; Other 2 dimensions: 1.5 x 0.7 cm  Composition: solid/almost completely solid (2)  Echogenicity: isoechoic (1)  *Given size (>/= 1.5 - 2.4 cm) and appearance, a follow-up ultrasound in 1 year should be considered based on TI-RADS criteria. _________________________________________________________   Small spongiform nodule in the left mid gland does not meet criteria for further evaluation.   IMPRESSION: 1. 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 2. There is a solitary questionable nodule versus pseudo nodule in the right mid gland which measures up to 1.6 cm. This abnormality meets criteria for surveillance. Recommend follow-up ultrasound in 1 year.   Pt denies: - hoarseness - dysphagia - choking - SOB with lying down  Pt does not have a FH of thyroid ds.  or autoimmune diseases.  No FH of thyroid cancer. No h/o radiation tx to head or neck.  No recent contrast studies. No steroid use. No herbal supplements. No Biotin use.  Pt. also has a history of ADD, HTN, anxiety/depression.  She has no menstrual cycles-on Nexplanon.  She does not plan to have anymore children.  ROS: Constitutional: + see HPI Eyes: no blurry vision, + bulging eyes ENT: no sore  throat, + see HPI Cardiovascular: no CP/SOB/+ palpitations/+ leg swelling Respiratory: no cough/SOB Gastrointestinal: no N/V/D/C, + hyper defecation Musculoskeletal: + Upper neck muscle pains/no joint aches Skin: no rashes Psychiatric: + Both: Depression/anxiety  Past Medical History:  Diagnosis Date   ANXIETY  03/06/2009   DEPRESSION 03/06/2009   Depression    Migraine    NSVD (normal spontaneous vaginal delivery) 09/12/2015   PROM (premature rupture of membranes) 09/09/2015   Rh negative status during pregnancy in second trimester, antepartum 05/31/2015   [ ] Rhophylac    Past Surgical History:  Procedure Laterality Date   WISDOM TOOTH EXTRACTION     Social History   Socioeconomic History   Marital status: Single    Spouse name: Not on file   Number of children: 1   Years of education: Not on file   Highest education level: Not on file  Occupational History   Occupation: Works in affordable housing  Tobacco Use   Smoking status: Every Day    Packs/day: 0.25    Years: 10.00    Pack years: 2.50    Types: Cigarettes    Last attempt to quit: 12/10/2011    Years since quitting: 10.0   Smokeless tobacco: Never   Tobacco comments:    on occasion  Substance and Sexual Activity   Alcohol use: Yes    Alcohol/week: 1.0 standard drink    Types: 1 Glasses of wine per week    Comment: occ   Drug use: No   Sexual activity: Yes    Birth control/protection: Implant  Other Topics Concern   Not on file  Social History Narrative   ** Merged History Encounter **       Social Determinants of Health   Financial Resource Strain: Not on file  Food Insecurity: Not on file  Transportation Needs: Not on file  Physical Activity: Not on file  Stress: Not on file  Social Connections: Not on file  Intimate Partner Violence: Not on file   Current Outpatient Medications on File Prior to Visit  Medication Sig Dispense Refill   ALPRAZolam (XANAX) 0.5 MG tablet Take 1 tablet (0.5 mg total) by mouth daily as needed for anxiety. 30 tablet 0   amphetamine-dextroamphetamine (ADDERALL XR) 30 MG 24 hr capsule Take 1 capsule (30 mg total) by mouth daily. 30 capsule 0   amphetamine-dextroamphetamine (ADDERALL) 10 MG tablet Take 1 tablet (10 mg total) by mouth daily as needed (in afternoon). 30 tablet 0    etonogestrel (NEXPLANON) 68 MG IMPL implant 1 each by Subdermal route once.     ibuprofen (ADVIL,MOTRIN) 600 MG tablet Take 1 tablet (600 mg total) by mouth every 6 (six) hours as needed. 30 tablet 0   sertraline (ZOLOFT) 100 MG tablet Take 1 tablet (100 mg total) by mouth daily. 90 tablet 0   sertraline (ZOLOFT) 25 MG tablet Take 1 tablet (25 mg total) by mouth daily. 90 tablet 0   tretinoin (RETIN-A) 0.025 % cream Apply topically at bedtime. 45 g 2   methimazole (TAPAZOLE) 10 MG tablet Take by mouth.     No current facility-administered medications on file prior to visit.   Allergies  Allergen Reactions   Doxycycline Hives and Rash   Family History  Problem Relation Age of Onset   Breast cancer Mother [id number]       "estrogen fed"   Diabetes Father    Obesity Father    Heart attack Father    Leukemia Father  mast cell leukemia   Obesity Brother    Alcohol abuse Brother    Other Brother        ?asbergers, cog delay   Cervical cancer Maternal Grandmother    Heart disease Maternal Grandfather        pacemaker   Leukemia Paternal Grandmother    Liver disease Brother     PE: BP 130/88 (BP Location: Right Arm, Patient Position: Sitting, Cuff Size: Normal)    Pulse (!) 110    Ht 5\' 4"  (1.626 m)    Wt 138 lb 12.8 oz (63 kg)    SpO2 98%    BMI 23.82 kg/m  Wt Readings from Last 3 Encounters:  12/14/21 138 lb 12.8 oz (63 kg)  05/25/21 133 lb 1.6 oz (60.4 kg)  10/04/20 138 lb 14.4 oz (63 kg)   Constitutional: normal weight, in NAD Eyes: PERRLA, EOMI, + exophthalmos with mildly disconjugate gaze, + lid lag, + stare ENT: moist mucous membranes, + significant bilateral thyromegaly, + right mid thyroid nodule palpable; no thyroid bruits, no cervical lymphadenopathy; 2 broken teeth in lower quadrants -without infection Cardiovascular: Tachycardia, RR, No MRG Respiratory: CTA B Musculoskeletal: no deformities, strength intact in all 4 Skin: moist, warm, no rashes, + pretibial myxedema  bilaterally Neurological: + tremor with outstretched hands  ASSESSMENT: 1. Thyrotoxicosis  2.  Thyroid nodules  3.  Thyroid ophthalmopathy  PLAN:  1. Patient with a long history of thyrotoxicosis, initially diagnosed in 2019, but symptomatic since approximately 2016, after the birth of her son.  She was initially started on methimazole but then came off as she could not return to see her endocrinologist.  Her TFTs have been uncontrolled now since at least 05/2020. -At today's visit, patient has tremors, weight loss, heat intolerance, hyperdefecation, palpitations, occasional tinnitus,  fatigue, anxiety/depression, inability to concentrate, hair loss, pretibial myxedema, 2 broken teeth.  She also has hypertension. On exam, she has significant exophthalmos with mildly disconjugate gaze, lid lag, stare, and also a large thyroid without a bruit. She has high blood pressure and tachycardia.  She displays the majority of Graves' signs and symptoms -At today's visit we discussed about possible etiologies for thyrotoxicosis including - We discussed that possible causes of thyrotoxicosis are:  Graves ds   Thyroiditis toxic multinodular goiter/ toxic adenoma -However, based on the timeline, thyroid ophthalmopathy, and the rest of the signs and symptoms, she appears to have Graves' disease. -At today's visit, will check the TSH, fT3 and fT4 and also add thyroid stimulating antibodies to screen for Graves' disease.   - we discussed about possible modalities of treatment for Graves ds., to include methimazole use (we will start this today), radioactive iodine ablation (I would not recommend this due to her large goiter, significant exophthalmos, and smoker status-she smokes half a pack a day) or  surgery.  In her case, my suggestion was to proceed with thyroidectomy.  We need to cool off her thyroid first before sending her for anesthesia with methimazole and we will also add atenolol to control her heart  rate and tremors and allow her to rest.  I will have her back for labs in 1 month and we will see her back in 2 months.  Depending on the results in 1 month, I suggested a referral to surgery at that time.  After discussion about risks of uncontrolled Graves' disease to include arrhythmia, hypercoagulability with increased the risk of heart attack and strokes and long-term osteoporosis, she does agree with this  plan. -We discussed about the fact that she would be on thyroid hormones after the surgery -In the meantime, I advised her against vigorous physical activity -As mentioned above, she does have signs of Graves' ophthalmopathy: she does not have double vision, blurry vision, eye pain, or significant chemosis.  I placed an urgent referral to ophthalmology today. - RTC in 2 months, but sooner for repeat labs  2. Thyroid nodules -Patient has 2 thyroid nodules seen on neck ultrasound from 05/2020.  One of them is subcentimeter and solid and meets the recommendation to be followed up.  She also has a left spongiform appearing nodule, which we discussed is a benign feature. -We discussed that thyroid nodules can appear the setting of inflammation from thyrotoxicosis and decided to repeat the ultrasound only after her thyrotoxicosis improves and she has been euthyroid for approximately a year in case we do not proceed with thyroidectomy  -We m in case we did not proceed withay need to do a thyroid uptake and scan, though, if the Graves' diagnosis is not clear. -As of now, she denies neck compression symptoms.  3.  Thyroid ophthalmopathy -Patient has a long history of exophthalmos, without active vision involvement -at today's exam she also has a lid lag and stare -We discussed that she needs to see ophthalmology right away.  She has seen optometry in the past. -At today's visit, placed an urgent referral for her to see Dr. Randon Goldsmith.  She may be a candidate for [employer].  - Total time spent for the visit:  1 hour, in obtaining medical information from the chart, reviewing her  previous labs, imaging evaluations, and treatments, reviewing her symptoms, counseling her about her conditions (please see the discussed topics above), and developing a plan to further investigate and treat them; she had a number of questions which I addressed.   Component     Latest Ref Rng & Units 12/14/2021  TSH     0.35 - 5.50 uIU/mL 0.00 Repeated and verified X2. (L)  T4,Free(Direct)     0.60 - 1.60 ng/dL 2.97 (H)  Triiodothyronine,Free,Serum     2.3 - 4.2 pg/mL 19.2 (H)  TSH is very low, while free T4 and free T3 are high.  I will advise her to start 10 mg of methimazole twice a day and repeat the tests in 4 weeks.  Component     Latest Ref Rng & Units 12/14/2021  TSI     <140 % baseline 401 (H)  TSIs are also elevated, confirming Graves ds.  Carlus Pavlov, MD PhD [facility] Endocrinology

## 2021-12-14 ENCOUNTER — Other Ambulatory Visit: Payer: Self-pay

## 2021-12-14 ENCOUNTER — Encounter: Payer: Self-pay | Admitting: Internal Medicine

## 2021-12-14 ENCOUNTER — Ambulatory Visit (INDEPENDENT_AMBULATORY_CARE_PROVIDER_SITE_OTHER): Payer: 59 | Admitting: Internal Medicine

## 2021-12-14 VITALS — BP 130/88 | HR 110 | Ht 64.0 in | Wt 138.8 lb

## 2021-12-14 DIAGNOSIS — H5789 Other specified disorders of eye and adnexa: Secondary | ICD-10-CM | POA: Diagnosis not present

## 2021-12-14 DIAGNOSIS — E0590 Thyrotoxicosis, unspecified without thyrotoxic crisis or storm: Secondary | ICD-10-CM | POA: Diagnosis not present

## 2021-12-14 DIAGNOSIS — E079 Disorder of thyroid, unspecified: Secondary | ICD-10-CM

## 2021-12-14 LAB — T3, FREE: T3, Free: 19.2 pg/mL — ABNORMAL HIGH (ref 2.3–4.2)

## 2021-12-14 LAB — T4, FREE: Free T4: 4.6 ng/dL — ABNORMAL HIGH (ref 0.60–1.60)

## 2021-12-14 LAB — TSH: TSH: 0 u[IU]/mL — ABNORMAL LOW (ref 0.35–5.50)

## 2021-12-14 MED ORDER — ATENOLOL 25 MG PO TABS: 25.0000 mg | ORAL_TABLET | Freq: Two times a day (BID) | ORAL | 3 refills | Status: DC

## 2021-12-14 MED ORDER — METHIMAZOLE 10 MG PO TABS: 10.0000 mg | ORAL_TABLET | Freq: Two times a day (BID) | ORAL | 3 refills | Status: DC

## 2021-12-14 NOTE — Patient Instructions (Addendum)
Please call and schedule an eye appt with Dr. Randon Goldsmith: [facility]:  Dr. Jeni Salles MD   Address: [address] [place], [place], [place] [address]  [phone]  Please stop at the lab.  Please start: - Methimazole 10 mg 2x a day with meals. - Atenolol 25 mg 2x a day  Please return in 2 months.  Hyperthyroidism Hyperthyroidism is when the thyroid gland is too active (overactive). The thyroid gland is a small gland located in the lower front part of the neck, just in front of the windpipe (trachea). This gland makes hormones that help control how the body uses food for energy (metabolism) as well as how the heart and brain function. These hormones also play a role in keeping your bones strong. When the thyroid is overactive, it produces too much of a hormone called thyroxine. What are the causes? This condition may be caused by: Graves' disease. This is a disorder in which the body's disease-fighting system (immune system) attacks the thyroid gland. This is the most common cause. Inflammation of the thyroid gland. A tumor in the thyroid gland. Use of certain medicines, including: Prescription thyroid hormone replacement. Herbal supplements that mimic thyroid hormones. Amiodarone therapy. Solid or fluid-filled lumps within your thyroid gland (thyroid nodules). Taking in a large amount of iodine from foods or medicines. What increases the risk? You are more likely to develop this condition if: You are female. You have a family history of thyroid conditions. You smoke tobacco. You use a medicine called lithium. You take medicines that affect the immune system (immunosuppressants). What are the signs or symptoms? Symptoms of this condition include: Nervousness. Inability to tolerate heat. Unexplained weight loss. Diarrhea. Change in the texture of hair or skin. Heart skipping beats or making extra beats. Rapid heart rate. Loss of menstruation. Shaky  hands. Fatigue. Restlessness. Sleep problems. Enlarged thyroid gland or a lump in the thyroid (nodule). You may also have symptoms of Graves' disease, which may include: Protruding eyes. Dry eyes. Red or swollen eyes. Problems with vision. How is this diagnosed? This condition may be diagnosed based on: Your symptoms and medical history. A physical exam. Blood tests. Thyroid ultrasound. This test involves using sound waves to produce images of the thyroid gland. A thyroid scan. A radioactive substance is injected into a vein, and images show how much iodine is present in the thyroid. Radioactive iodine uptake test (RAIU). A small amount of radioactive iodine is given by mouth to see how much iodine the thyroid absorbs after a certain amount of time. How is this treated? Treatment depends on the cause and severity of the condition. Treatment may include: Medicines to reduce the amount of thyroid hormone your body makes. Radioactive iodine treatment (radioiodine therapy). This involves swallowing a small dose of radioactive iodine, in capsule or liquid form, to kill thyroid cells. Surgery to remove part or all of your thyroid gland. You may need to take thyroid hormone replacement medicine for the rest of your life after thyroid surgery. Medicines to help manage your symptoms. Follow these instructions at home:  Take over-the-counter and prescription medicines only as told by your health care provider. Do not use any products that contain nicotine or tobacco, such as cigarettes and e-cigarettes. If you need help quitting, ask your health care provider. Follow any instructions from your health care provider about diet. You may be instructed to limit foods that contain iodine. Keep all follow-up visits as told by your health care provider. This  is important. You will need to have blood tests regularly so that your health care provider can monitor your condition. Contact a health care  provider if: Your symptoms do not get better with treatment. You have a fever. You are taking thyroid hormone replacement medicine and you: Have symptoms of depression. Feel like you are tired all the time. Gain weight. Get help right away if: You have chest pain. You have decreased alertness or a change in your awareness. You have abdominal pain. You feel dizzy. You have a rapid heartbeat. You have an irregular heartbeat. You have difficulty breathing. Summary The thyroid gland is a small gland located in the lower front part of the neck, just in front of the windpipe (trachea). Hyperthyroidism is when the thyroid gland is too active (overactive) and produces too much of a hormone called thyroxine. The most common cause is Graves' disease, a disorder in which your immune system attacks the thyroid gland. Hyperthyroidism can cause various symptoms, such as unexplained weight loss, nervousness, inability to tolerate heat, or changes in your heartbeat. Treatment may include medicine to reduce the amount of thyroid hormone your body makes, radioiodine therapy, surgery, or medicines to manage symptoms. This information is not intended to replace advice given to you by your health care provider. Make sure you discuss any questions you have with your health care provider. Document Revised: 08/10/2020 Document Reviewed: 08/10/2020 Elsevier Patient Education  2022 [employer].

## 2021-12-18 LAB — THYROID STIMULATING IMMUNOGLOBULIN: TSI: 401 % baseline — ABNORMAL HIGH (ref <140)

## 2021-12-31 ENCOUNTER — Other Ambulatory Visit: Payer: Self-pay | Admitting: Family Medicine

## 2021-12-31 DIAGNOSIS — F411 Generalized anxiety disorder: Secondary | ICD-10-CM

## 2022-01-02 MED ORDER — SERTRALINE HCL 25 MG PO TABS: 25.0000 mg | ORAL_TABLET | Freq: Every day | ORAL | 0 refills | Status: DC

## 2022-01-02 MED ORDER — SERTRALINE HCL 100 MG PO TABS: 100.0000 mg | ORAL_TABLET | Freq: Every day | ORAL | 0 refills | Status: DC

## 2022-01-03 MED ORDER — AMPHETAMINE-DEXTROAMPHET ER 30 MG PO CP24: 30.0000 mg | ORAL_CAPSULE | Freq: Every day | ORAL | 0 refills | Status: DC

## 2022-01-03 MED ORDER — AMPHETAMINE-DEXTROAMPHETAMINE 10 MG PO TABS: 10.0000 mg | ORAL_TABLET | Freq: Every day | ORAL | 0 refills | Status: DC | PRN

## 2022-01-03 MED ORDER — ALPRAZOLAM 0.5 MG PO TABS: 0.5000 mg | ORAL_TABLET | Freq: Every day | ORAL | 0 refills | Status: DC | PRN

## 2022-01-08 ENCOUNTER — Ambulatory Visit (INDEPENDENT_AMBULATORY_CARE_PROVIDER_SITE_OTHER): Payer: 59 | Admitting: Family Medicine

## 2022-01-08 VITALS — BP 120/70 | HR 125 | Temp 97.7°F | Wt 133.7 lb

## 2022-01-08 DIAGNOSIS — M791 Myalgia, unspecified site: Secondary | ICD-10-CM

## 2022-01-08 DIAGNOSIS — M2550 Pain in unspecified joint: Secondary | ICD-10-CM

## 2022-01-08 LAB — CBC WITH DIFFERENTIAL/PLATELET
Basophils Absolute: 0 10*3/uL (ref 0.0–0.1)
Basophils Relative: 0.4 % (ref 0.0–3.0)
Eosinophils Absolute: 0.1 10*3/uL (ref 0.0–0.7)
Eosinophils Relative: 1.2 % (ref 0.0–5.0)
HCT: 41.8 % (ref 36.0–46.0)
Hemoglobin: 13.7 g/dL (ref 12.0–15.0)
Lymphocytes Relative: 17.2 % (ref 12.0–46.0)
Lymphs Abs: 1.4 10*3/uL (ref 0.7–4.0)
MCHC: 32.8 g/dL (ref 30.0–36.0)
MCV: 85.5 fl (ref 78.0–100.0)
Monocytes Absolute: 1.1 10*3/uL — ABNORMAL HIGH (ref 0.1–1.0)
Monocytes Relative: 13.3 % — ABNORMAL HIGH (ref 3.0–12.0)
Neutro Abs: 5.6 10*3/uL (ref 1.4–7.7)
Neutrophils Relative %: 67.9 % (ref 43.0–77.0)
Platelets: 213 10*3/uL (ref 150.0–400.0)
RBC: 4.89 Mil/uL (ref 3.87–5.11)
RDW: 12.9 % (ref 11.5–15.5)
WBC: 8.2 10*3/uL (ref 4.0–10.5)

## 2022-01-08 LAB — COMPREHENSIVE METABOLIC PANEL
ALT: 24 U/L (ref 0–35)
AST: 14 U/L (ref 0–37)
Albumin: 4 g/dL (ref 3.5–5.2)
Alkaline Phosphatase: 113 U/L (ref 39–117)
BUN: 18 mg/dL (ref 6–23)
CO2: 31 mEq/L (ref 19–32)
Calcium: 9.9 mg/dL (ref 8.4–10.5)
Chloride: 102 mEq/L (ref 96–112)
Creatinine, Ser: 0.57 mg/dL (ref 0.40–1.20)
GFR: 121.44 mL/min (ref >60.00)
Glucose, Bld: 90 mg/dL (ref 70–99)
Potassium: 5.5 mEq/L — ABNORMAL HIGH (ref 3.5–5.1)
Sodium: 138 mEq/L (ref 135–145)
Total Bilirubin: 0.3 mg/dL (ref 0.2–1.2)
Total Protein: 7.5 g/dL (ref 6.0–8.3)

## 2022-01-08 LAB — SEDIMENTATION RATE: Sed Rate: 23 mm/hr — ABNORMAL HIGH (ref 0–20)

## 2022-01-08 LAB — C-REACTIVE PROTEIN: CRP: <1 mg/dL (ref 0.5–20.0)

## 2022-01-08 NOTE — Progress Notes (Signed)
Established Patient Office Visit  Subjective:  Patient ID: Leah Perez, female    DOB: 08-08-1991  Age: 31 y.o. MRN: [id number]  CC:  Chief Complaint  Patient presents with   Generalized Body Aches    Full body aches x 4-5 days, very painful, comes and goes    HPI Leah Perez presents for severe body aches and joint aches past several days.  She actually first noticed this Friday.  She was celebrating her birthday with a friend and when she was eating out she noticed severe achiness especially in her thighs.  She had polyarthralgias with multiple joints that were very achy including knees, elbows, wrist, hip, back.  She has taken some ibuprofen which has provided some temporary relief.  She has not seen any visible swelling.  She has had some myalgias but particularly arthralgias.  She has not noted any proximal muscle weakness.  Symptoms to some extent come and go.  No family history of inflammatory arthritis.  Patient does have history of hyperthyroidism from Graves' disease and is on Tapazole but has been on this for quite some time.  Tapazole can cause arthralgias and myalgias but she has been on this for couple of years.  She denies any recent skin rashes.  No fever.  No history of tick bite.  No headache.  Past Medical History:  Diagnosis Date   ANXIETY 03/06/2009   DEPRESSION 03/06/2009   Depression    Migraine    NSVD (normal spontaneous vaginal delivery) 09/12/2015   PROM (premature rupture of membranes) 09/09/2015   Rh negative status during pregnancy in second trimester, antepartum 05/31/2015   [ ] Rhophylac    Thyroid disease     Past Surgical History:  Procedure Laterality Date   WISDOM TOOTH EXTRACTION      Family History  Problem Relation Age of Onset   Breast cancer Mother 40       "estrogen fed"   Cancer Mother    Diabetes Father    Obesity Father    Heart attack Father    Leukemia Father        mast cell leukemia   Cancer Father    Obesity  Brother    Alcohol abuse Brother    Other Brother        ?asbergers, cog delay   Intellectual disability Brother    Cervical cancer Maternal Grandmother    Cancer Maternal Grandmother    Heart disease Maternal Grandfather        pacemaker   Leukemia Paternal Grandmother    Liver disease Brother    Cancer Maternal Uncle     Social History   Socioeconomic History   Marital status: Single    Spouse name: Not on file   Number of children: 1   Years of education: Not on file   Highest education level: Not on file  Occupational History   Occupation: Works in affordable housing  Tobacco Use   Smoking status: Every Day    Packs/day: 0.25    Years: 10.00    Pack years: 2.50    Types: Cigarettes    Last attempt to quit: 12/10/2011    Years since quitting: 10.0   Smokeless tobacco: Never   Tobacco comments:    on occasion  Substance and Sexual Activity   Alcohol use: Yes    Alcohol/week: 1.0 standard drink    Types: 1 Glasses of wine per week    Comment: occ   Drug use: No  Sexual activity: Yes    Birth control/protection: Implant  Other Topics Concern   Not on file  Social History Narrative   ** Merged History Encounter **       Social Determinants of Health   Financial Resource Strain: Not on file  Food Insecurity: Not on file  Transportation Needs: Not on file  Physical Activity: Not on file  Stress: Not on file  Social Connections: Not on file  Intimate Partner Violence: Not on file    Outpatient Medications Prior to Visit  Medication Sig Dispense Refill   ALPRAZolam (XANAX) 0.5 MG tablet Take 1 tablet (0.5 mg total) by mouth daily as needed for anxiety. 30 tablet 0   amphetamine-dextroamphetamine (ADDERALL XR) 30 MG 24 hr capsule Take 1 capsule (30 mg total) by mouth daily. 30 capsule 0   amphetamine-dextroamphetamine (ADDERALL) 10 MG tablet Take 1 tablet (10 mg total) by mouth daily as needed (in afternoon). 30 tablet 0   atenolol (TENORMIN) 25 MG tablet  Take 1 tablet (25 mg total) by mouth 2 (two) times daily. 180 tablet 3   etonogestrel (NEXPLANON) 68 MG IMPL implant 1 each by Subdermal route once.     ibuprofen (ADVIL,MOTRIN) 600 MG tablet Take 1 tablet (600 mg total) by mouth every 6 (six) hours as needed. 30 tablet 0   methimazole (TAPAZOLE) 10 MG tablet Take 1 tablet (10 mg total) by mouth 2 (two) times daily. 120 tablet 3   sertraline (ZOLOFT) 100 MG tablet Take 1 tablet (100 mg total) by mouth daily. 90 tablet 0   sertraline (ZOLOFT) 25 MG tablet Take 1 tablet (25 mg total) by mouth daily. 90 tablet 0   tretinoin (RETIN-A) 0.025 % cream Apply topically at bedtime. 45 g 2   No facility-administered medications prior to visit.    Allergies  Allergen Reactions   Doxycycline Hives and Rash    ROS Review of Systems  Constitutional:  Negative for chills and fever.  Respiratory:  Negative for cough and shortness of breath.   Cardiovascular:  Negative for chest pain.  Gastrointestinal:  Negative for abdominal pain.  Genitourinary:  Negative for dysuria.  Musculoskeletal:  Positive for arthralgias, back pain and myalgias. Negative for joint swelling.  Skin:  Negative for rash.  Hematological:  Negative for adenopathy.     Objective:    Physical Exam Vitals reviewed.  Constitutional:      Appearance: Normal appearance.  Cardiovascular:     Rate and Rhythm: Normal rate.     Comments: Heart rate is slightly elevated around 100 but regular Pulmonary:     Effort: Pulmonary effort is normal.     Breath sounds: Normal breath sounds.  Musculoskeletal:     Cervical back: Neck supple.     Right lower leg: No edema.     Left lower leg: No edema.     Comments: No visible joint swelling.  No effusions.  No warmth.  No erythema.  Lymphadenopathy:     Cervical: No cervical adenopathy.  Skin:    Findings: No rash.  Neurological:     Mental Status: She is alert.    BP 120/70 (BP Location: Left Arm, Patient Position: Sitting, Cuff  Size: Normal)    Pulse (!) 125    Temp 97.7 F (36.5 C) (Oral)    Wt 133 lb 11.2 oz (60.6 kg)    SpO2 99%    BMI 22.95 kg/m  Wt Readings from Last 3 Encounters:  01/08/22 133 lb 11.2 oz (  60.6 kg)  12/14/21 138 lb 12.8 oz (63 kg)  05/25/21 133 lb 1.6 oz (60.4 kg)     Health Maintenance Due  Topic Date Due   COVID-19 Vaccine (1) Never done   Hepatitis C Screening  Never done    There are no preventive care reminders to display for this patient.  Lab Results  Component Value Date   TSH 0.00 Repeated and verified X2. (L) 12/14/2021   Lab Results  Component Value Date   WBC 6.5 05/25/2021   HGB 14.3 05/25/2021   HCT 41.5 05/25/2021   MCV 85.2 05/25/2021   PLT 241.0 05/25/2021   Lab Results  Component Value Date   NA 141 05/25/2021   K 4.2 05/25/2021   CO2 27 05/25/2021   GLUCOSE 75 05/25/2021   BUN 15 05/25/2021   CREATININE 0.58 05/25/2021   BILITOT 0.5 05/25/2021   ALKPHOS 94 05/25/2021   AST 18 05/25/2021   ALT 25 05/25/2021   PROT 6.9 05/25/2021   ALBUMIN 4.1 05/25/2021   CALCIUM 9.2 05/25/2021   ANIONGAP 7 04/07/2015   GFR 121.47 05/25/2021   Lab Results  Component Value Date   CHOL 127 05/25/2021   Lab Results  Component Value Date   HDL 37.60 (L) 05/25/2021   Lab Results  Component Value Date   LDLCALC 68 05/25/2021   Lab Results  Component Value Date   TRIG 107.0 05/25/2021   Lab Results  Component Value Date   CHOLHDL 3 05/25/2021   Lab Results  Component Value Date   HGBA1C 5.2 05/18/2020      Assessment & Plan:   Problem List Items Addressed This Visit   None Visit Diagnoses     Polyarthralgia    -  Primary   Relevant Orders   CBC with Differential/Platelet   ANA   Rheumatoid Factor   Cyclic citrul peptide antibody, IgG   CMP   C-reactive Protein   Sedimentation rate     Patient has history of Graves' disease treated with Tapazole.  She is seen today with relatively acute onset of polyarthralgias > myalgias starting last  Friday.  No reported fever, rash, or any other systemic findings.  She does not have objective evidence of other acute inflammation of joints at this time  -Check some lab work with sed rate, C-reactive protein, CBC, CMP, ANA, rheumatoid factor, CCP antibody. -In the meantime continue ibuprofen and/or Tylenol for joint aches. -If above all normal and symptoms continue recommend she discuss with endocrinologist  No orders of the defined types were placed in this encounter.   Follow-up: No follow-ups on file.    Carolann Littler, MD

## 2022-01-09 ENCOUNTER — Ambulatory Visit: Payer: 59 | Admitting: Family Medicine

## 2022-01-10 LAB — ANA: Anti Nuclear Antibody (ANA): NEGATIVE

## 2022-01-10 LAB — CYCLIC CITRUL PEPTIDE ANTIBODY, IGG: Cyclic Citrullin Peptide Ab: <16 UNITS

## 2022-01-10 LAB — RHEUMATOID FACTOR: Rheumatoid fact SerPl-aCnc: <14 IU/mL (ref <14)

## 2022-01-11 ENCOUNTER — Encounter: Payer: Self-pay | Admitting: Family Medicine

## 2022-01-13 MED ORDER — LISDEXAMFETAMINE DIMESYLATE 40 MG PO CAPS: 40.0000 mg | ORAL_CAPSULE | ORAL | 0 refills | Status: DC

## 2022-02-05 ENCOUNTER — Other Ambulatory Visit: Payer: Self-pay

## 2022-02-05 ENCOUNTER — Encounter: Payer: Self-pay | Admitting: Internal Medicine

## 2022-02-05 ENCOUNTER — Ambulatory Visit (INDEPENDENT_AMBULATORY_CARE_PROVIDER_SITE_OTHER): Payer: 59 | Admitting: Internal Medicine

## 2022-02-05 VITALS — BP 118/68 | HR 88 | Ht 64.0 in | Wt 137.0 lb

## 2022-02-05 DIAGNOSIS — E0500 Thyrotoxicosis with diffuse goiter without thyrotoxic crisis or storm: Secondary | ICD-10-CM

## 2022-02-05 DIAGNOSIS — H5789 Other specified disorders of eye and adnexa: Secondary | ICD-10-CM | POA: Diagnosis not present

## 2022-02-05 DIAGNOSIS — E042 Nontoxic multinodular goiter: Secondary | ICD-10-CM

## 2022-02-05 DIAGNOSIS — E079 Disorder of thyroid, unspecified: Secondary | ICD-10-CM

## 2022-02-05 NOTE — Progress Notes (Signed)
Patient ID: Leah Perez, female   DOB: 31-Dec-1990, 31 y.o.   MRN: [id number]  This visit occurred during the SARS-CoV-2 public health emergency.  Safety protocols were in place, including screening questions prior to the visit, additional usage of staff PPE, and extensive cleaning of exam room while observing appropriate contact time as indicated for disinfecting solutions.   HPI  Leah Perez is a 31 y.o.-year-old female, initially referred by her PCP, Dr. Hassan Rowan, returning for follow-up for Graves' disease and thyroid nodules.  Last visit 1.5 months ago.  Interim history: Since last visit we started her back on methimazole. She gained 4 lbs since last OV, heat intolerance improved, still occasional tremors and palpitations, also tinnitus.  She feels much better overall. She had joint muscle aches for few days last week, but these resolved. Covid negative.  Reviewed and addended history: Patient mentions that she noticed that her eyes were bulging after the birth of her son in 2016.  She was found to be thyrotoxic in 2019 and saw endocrinology (Dr. Tedd Sias). She was started on MMI 10 mg daily and reportedly her TFTs were controlled until 2021 when she could not return to see Dr. Tedd Sias due to a change in insurance.  I was able to review thyroid tests from 2021 and 2022 in [facility] and her TSH was undetectable while free thyroid hormones were high and she mentions that at that time she was off methimazole as she could not refill the medication.  She remains off methimazole now.  She initially felt better on methimazole, with less fatigue and better concentration.  However, after coming off methimazole, she started to have weight loss (~4-6 sizes), tremors, palpitations, heat intolerance, hair loss, hyper defecation and loss of concentration (Adderall dose was increased).  Also, she has a history of HTN, swelling in the legs in last few years, and feels her teeth are "crumbling".  I  reviewed pt's thyroid tests: Lab Results  Component Value Date   TSH 0.00 Repeated and verified X2. (L) 12/14/2021   TSH <0.01 (L) 05/25/2021   TSH <0.01 (L) 05/17/2020   FREET4 4.60 (H) 12/14/2021   FREET4 4.25 (H) 05/25/2021   FREET4 3.85 (H) 05/17/2020   T3FREE 19.2 (H) 12/14/2021   T3FREE 10.3 (H) 05/17/2020   Also, per review of records from [facility]: 01/23/2021: TSH <0.01, free T4 3.32,  10/27/2020: TSH <0.01, free T4 3.89 (0.66-1.14), total T3 407 (87-178) 03/08/2010: TSH 1.26  Antithyroid antibodies: Lab Results  Component Value Date   TSI 401 (H) 12/14/2021    Thyroid ultrasound on 05/26/2020 and this showed 2 small nodules: Parenchymal Echotexture: Moderately heterogenous Isthmus: 0.8 cm Right lobe: 5.6 x 2.5 x 2.7 cm Left lobe: 5.8 x 2.5 x 2.5 cm ______________________________________________ Nodule # 1: Location: Right; Mid  Maximum size: 1.6 cm; Other 2 dimensions: 1.5 x 0.7 cm  Composition: solid/almost completely solid (2)  Echogenicity: isoechoic (1)  *Given size (>/= 1.5 - 2.4 cm) and appearance, a follow-up ultrasound in 1 year should be considered based on TI-RADS criteria. _________________________________________________________   Small spongiform nodule in the left mid gland does not meet criteria for further evaluation.   IMPRESSION: 1. 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 2. There is a solitary questionable nodule versus pseudo nodule in the right mid gland which measures up to 1.6 cm. This abnormality meets criteria for surveillance. Recommend follow-up ultrasound in 1 year.   Pt denies: - hoarseness - dysphagia -  choking - SOB with lying down  Pt does not have a FH of thyroid ds.  or autoimmune diseases.  No FH of thyroid cancer. No h/o radiation tx to head or neck. No recent contrast studies. No steroid use. No herbal supplements. No Biotin use.  Pt. also has a history of ADD,  HTN, anxiety/depression.  She has no menstrual cycles-on Nexplanon.  She does not plan to have anymore children.  ROS: + See HPI  Past Medical History:  Diagnosis Date   ANXIETY 03/06/2009   DEPRESSION 03/06/2009   Depression    Migraine    NSVD (normal spontaneous vaginal delivery) 09/12/2015   PROM (premature rupture of membranes) 09/09/2015   Rh negative status during pregnancy in second trimester, antepartum 05/31/2015   [ ] Rhophylac    Thyroid disease    Past Surgical History:  Procedure Laterality Date   WISDOM TOOTH EXTRACTION     Social History   Socioeconomic History   Marital status: Single    Spouse name: Not on file   Number of children: 1   Years of education: Not on file   Highest education level: Not on file  Occupational History   Occupation: Works in affordable housing  Tobacco Use   Smoking status: Every Day    Packs/day: 0.25    Years: 10.00    Pack years: 2.50    Types: Cigarettes    Last attempt to quit: 12/10/2011    Years since quitting: 10.1   Smokeless tobacco: Never   Tobacco comments:    on occasion  Substance and Sexual Activity   Alcohol use: Yes    Alcohol/week: 1.0 standard drink    Types: 1 Glasses of wine per week    Comment: occ   Drug use: No   Sexual activity: Yes    Birth control/protection: Implant  Other Topics Concern   Not on file  Social History Narrative   ** Merged History Encounter **       Social Determinants of Health   Financial Resource Strain: Not on file  Food Insecurity: Not on file  Transportation Needs: Not on file  Physical Activity: Not on file  Stress: Not on file  Social Connections: Not on file  Intimate Partner Violence: Not on file   Current Outpatient Medications on File Prior to Visit  Medication Sig Dispense Refill   ALPRAZolam (XANAX) 0.5 MG tablet Take 1 tablet (0.5 mg total) by mouth daily as needed for anxiety. 30 tablet 0   atenolol (TENORMIN) 25 MG tablet Take 1 tablet (25 mg  total) by mouth 2 (two) times daily. 180 tablet 3   etonogestrel (NEXPLANON) 68 MG IMPL implant 1 each by Subdermal route once.     ibuprofen (ADVIL,MOTRIN) 600 MG tablet Take 1 tablet (600 mg total) by mouth every 6 (six) hours as needed. 30 tablet 0   lisdexamfetamine (VYVANSE) 40 MG capsule Take 1 capsule (40 mg total) by mouth every morning. 30 capsule 0   methimazole (TAPAZOLE) 10 MG tablet Take 1 tablet (10 mg total) by mouth 2 (two) times daily. 120 tablet 3   sertraline (ZOLOFT) 100 MG tablet Take 1 tablet (100 mg total) by mouth daily. 90 tablet 0   sertraline (ZOLOFT) 25 MG tablet Take 1 tablet (25 mg total) by mouth daily. 90 tablet 0   tretinoin (RETIN-A) 0.025 % cream Apply topically at bedtime. 45 g 2   No current facility-administered medications on file prior to visit.   Allergies  Allergen Reactions   Doxycycline Hives and Rash   Family History  Problem Relation Age of Onset   Breast cancer Mother 31       "estrogen fed"   Cancer Mother    Diabetes Father    Obesity Father    Heart attack Father    Leukemia Father        mast cell leukemia   Cancer Father    Obesity Brother    Alcohol abuse Brother    Other Brother        ?asbergers, cog delay   Intellectual disability Brother    Cervical cancer Maternal Grandmother    Cancer Maternal Grandmother    Heart disease Maternal Grandfather        pacemaker   Leukemia Paternal Grandmother    Liver disease Brother    Cancer Maternal Uncle     PE: BP 118/68 (BP Location: Right Arm, Patient Position: Sitting, Cuff Size: Normal)    Pulse 88    Ht 5\' 4"  (1.626 m)    Wt 137 lb (62.1 kg)    SpO2 98%    BMI 23.52 kg/m  Wt Readings from Last 3 Encounters:  02/05/22 137 lb (62.1 kg)  01/08/22 133 lb 11.2 oz (60.6 kg)  12/14/21 138 lb 12.8 oz (63 kg)   Constitutional: normal weight, in NAD Eyes: PERRLA, EOMI, + exophthalmos with mildly disconjugate gaze, + lid lag, + stare ENT: moist mucous membranes, + decreased  bilateral thyromegaly, + right mid thyroid nodule palpable; , no cervical lymphadenopathy; 2 broken teeth in lower quadrants -without infection Cardiovascular: Tachycardia, RR, No MRG Respiratory: CTA B Musculoskeletal: no deformities, strength intact in all 4 Skin: moist, warm, no rashes, + improved pretibial myxedema bilaterally Neurological: + tremor with outstretched hands and also trunk tremor  ASSESSMENT: 1. Thyrotoxicosis  2.  Thyroid nodules  3.  Thyroid ophthalmopathy  PLAN:  1. Patient with a long history of thyrotoxicosis, initially diagnosed in 2019, but symptomatic since approximately 2016, after the birth of her son.  She was initially started on methimazole but then came off that she could not return to see her endocrinologist.  Her TFTs have been uncontrolled since at least 05/2020. -At last visit, patient had tremors, weight loss, heat intolerance, hyperdefecation, palpitations, occasional tinnitus, fatigue, anxiety/depression, inability to concentrate, hair loss, pretibial myxedema, 2 broken teeth.  She also has hypertension.  On exam, she had significant exophthalmos and other eye changes and she also had a large thyroid.  She had high blood pressure and tachycardia.  Therefore, she displayed the majority of Graves' signs and symptoms. -We checked TSI antibodies and these were positive, confirming Graves' disease -TFTs were very abnormal so we ended up restarting methimazole at 10 mg twice a day -she started to feel much better after starting methimazole.  She still has some tremors and occasional palpitations but she gained weight, her blood pressure is better, her concentration improved, she does not have heat intolerance anymore and her pretibial myxedema decreased so her ankles are visible -At today's visit, we will check a TSH, free T4, free T3 and change the methimazole dose accordingly -We again discussed about definitive treatment for Graves' disease; I would not  recommend radioactive iodine ablation due to her large goiter, significant exophthalmos and smoker status-she smokes half a pack a day.  However, we most likely need to proceed with surgery after we cool off her thyroid.  We discussed about referral to surgery, with which she agrees, if  needed.  I advised her that she will be on thyroid medication afterwards.  Discussed about how to take levothyroxine correctly: Fasting, every day, with water, at least 30 minutes before breakfast, separated by at least 4 hours from: - acid reflux medications - calcium - iron - multivitamins -However, if we can control her TFTs with methimazole, for now, we can delay the referral to surgery.  Of note, no further plans for pregnancy.  She has a 31-year-old son. -She has a lid lag and stare, along with exophthalmos and mildly disconjugate gaze, but no double vision, chemosis, eye pain.  An ophthalmology appointment is pending. - RTC in 2 months, but possibly sooner for repeat labs  2. Thyroid nodules -Patient has a history of 2 thyroid nodules seen on the neck ultrasound from 05/2020.  One of them is subcentimeter and solid and meets the recommendation for follow-up.  She also has a left spongiform appearing nodule, which we discussed is a benign feature. -Thyroid nodules can appear in the setting of inflammation from thyrotoxicosis we decided to repeat the ultrasound after her thyrotoxicosis improved and especially after she has been euthyroid for at least several months to a year (case we do not proceed with thyroidectomy) -She denies neck compression symptoms  3.  Thyroid ophthalmopathy -Patient has a long history of exophthalmos, without activation involvement -She does have a lid lag and stare -We discussed at last visit that she needed to see ophthalmology right away.  She had seen optometry in the past.  I placed an urgent referral to ophthalmology at last visit, with a question whether she may be a candidate for  Jordanepezza.  However, we could not get her in with any office as of yet.  She will be able to see ophthalmology in ~1.5 months per discussion with the referral coordinator.  I advised the patient that if she does not receive an appointment in approximately 2 weeks to contact us.  Component     Latest Ref Rng & Units 12/14/2021 02/05/2022  TSH     0.35 - 5.50 uIU/mL 0.00 Repeated and verified X2. (L) <0.01 (L)  T4,Free(Direct)     0.60 - 1.60 ng/dL 1.614.60 (H) 0.964.42 (H)  Triiodothyronine,Free,Serum     2.3 - 4.2 pg/mL 19.2 (H) 14.3 (H)  Thyroid tests remain quite abnormal, so at this point, I would suggest to increase the methimazole dose to 20 mg twice a day and will discuss with her to refer her to surgery.  We will plan to check another set of labs in 4 weeks.  Carlus Pavlovristina Asja Frommer, MD PhD [facility]

## 2022-02-05 NOTE — Patient Instructions (Addendum)
Please continue Methimazole 10 mg 2x a day.  Please stop at the lab.  Please come back for a follow-up appointment in 3 months.

## 2022-02-06 LAB — TSH: TSH: <0.01 u[IU]/mL — ABNORMAL LOW (ref 0.35–5.50)

## 2022-02-06 LAB — T3, FREE: T3, Free: 14.3 pg/mL — ABNORMAL HIGH (ref 2.3–4.2)

## 2022-02-06 LAB — T4, FREE: Free T4: 4.42 ng/dL — ABNORMAL HIGH (ref 0.60–1.60)

## 2022-02-07 MED ORDER — METHIMAZOLE 10 MG PO TABS: 20.0000 mg | ORAL_TABLET | Freq: Two times a day (BID) | ORAL | 3 refills | Status: DC

## 2022-02-08 ENCOUNTER — Encounter: Payer: Self-pay | Admitting: Family Medicine

## 2022-02-08 ENCOUNTER — Telehealth (INDEPENDENT_AMBULATORY_CARE_PROVIDER_SITE_OTHER): Payer: 59 | Admitting: Family Medicine

## 2022-02-08 DIAGNOSIS — E0590 Thyrotoxicosis, unspecified without thyrotoxic crisis or storm: Secondary | ICD-10-CM

## 2022-02-08 DIAGNOSIS — F900 Attention-deficit hyperactivity disorder, predominantly inattentive type: Secondary | ICD-10-CM

## 2022-02-08 DIAGNOSIS — F411 Generalized anxiety disorder: Secondary | ICD-10-CM | POA: Diagnosis not present

## 2022-02-08 MED ORDER — AMPHETAMINE-DEXTROAMPHETAMINE 10 MG PO TABS: 10.0000 mg | ORAL_TABLET | Freq: Every day | ORAL | 0 refills | Status: DC | PRN

## 2022-02-08 MED ORDER — ALPRAZOLAM 0.5 MG PO TABS: 0.5000 mg | ORAL_TABLET | Freq: Every day | ORAL | 0 refills | Status: DC | PRN

## 2022-02-08 MED ORDER — AMPHETAMINE-DEXTROAMPHET ER 30 MG PO CP24: 30.0000 mg | ORAL_CAPSULE | ORAL | 0 refills | Status: DC

## 2022-02-08 NOTE — Progress Notes (Signed)
Virtual Visit via Video Note ? ?I connected with Leah Perez  on 02/08/22 at  2:00 PM EST by a video enabled telemedicine application and verified that I am speaking with the correct person using two identifiers. ? Location patient: home ?Location provider: ? Health Care  ?[address]  ?[place], [place] [address] ?Persons participating in the virtual visit: patient, provider ? ?I discussed the limitations of evaluation and management by telemedicine and the availability of in person appointments. The patient expressed understanding and agreed to proceed. ? ? ?Leah Perez ?DOB: 1991/08/28 ?Encounter date: 02/08/2022 ? ?This is a 31 y.o. female who presents with ?No chief complaint on file. ? ? ?History of present illness: ?She did find pharmacy that has adderall and would like adderall sent there.  ? ?She is planning on having surgery due to hyperthyroid. She would like to wait until she moves into her house. Right now she is living in ex's house.  ? ?She got promotion at work,so that was good. Dropped stressful property. Work is low stress at office.  ? ?Vyvanse does not help her with focus like the adderall. Feels like she isn't taking anything at all. She found [facility] on [address] in [place]. ? ?Feels better with zoloft 125mg  daily. She is more motivated, clear. Not needing xanax as much. Feels less anxious. Using xanax about 5 x/month.  ? ?Allergies  ?Allergen Reactions  ? Doxycycline Hives and Rash  ? ?No outpatient medications have been marked as taking for the 02/08/22 encounter (Appointment) with 04/10/22, MD.  ? ? ?Review of Systems  ?Constitutional:  Negative for chills, fatigue and fever.  ?Respiratory:  Negative for cough, chest tightness, shortness of breath and wheezing.   ?Cardiovascular:  Negative for chest pain, palpitations and leg swelling.  ?Psychiatric/Behavioral:  Positive for decreased concentration. Nervous/anxious: much better on the  zoloft125mg .   ? ?Objective: ? ?There were no vitals taken for this visit.     ? ?BP Readings from Last 3 Encounters:  ?02/05/22 118/68  ?01/08/22 120/70  ?12/14/21 130/88  ? ?Wt Readings from Last 3 Encounters:  ?02/05/22 137 lb (62.1 kg)  ?01/08/22 133 lb 11.2 oz (60.6 kg)  ?12/14/21 138 lb 12.8 oz (63 kg)  ? ? ?EXAM: ? ?GENERAL: alert, oriented, appears well and in no acute distress ? ?HEENT: atraumatic, conjunctiva clear, no obvious abnormalities on inspection of external nose and ears ? ?NECK: normal movements of the head and neck ? ?LUNGS: on inspection no signs of respiratory distress, breathing rate appears normal, no obvious gross SOB, gasping or wheezing ? ?CV: no obvious cyanosis ? ?MS: moves all visible extremities without noticeable abnormality ? ?PSYCH/NEURO: pleasant and cooperative, no obvious depression or anxiety, speech and thought processing grossly intact. Overall anxiety is much better.mood is better,not tearful. ? ? ?Assessment/Plan ? ?1. Anxiety state ?Doing well with zoloft 125mg  daily. Using xanax about 5x/month at this point. Doing better with this. Will refill so she has enough for prn use, but should not need regular refills going forward. ?- ALPRAZolam (XANAX) 0.5 MG tablet; Take 1 tablet (0.5 mg total) by mouth daily as needed for anxiety.  Dispense: 30 tablet; Refill: 0 ? ?2. Hyperthyroidism ?Following with Dr. 02/11/22. In stages of planning for surgery since this has not responded to medication.  ? ?3. Attention deficit hyperactivity disorder (ADHD), predominantly inattentive type ?Does well on adderall. She found pharmacy that has this in stock.rx sent today. Consider mail order for future  refills.  ? ?Return in about 6 months (around 08/11/2022) for Chronic condition visit. ? ? ? ?I discussed the assessment and treatment plan with the patient. The patient was provided an opportunity to ask questions and all were answered. The patient agreed with the plan and demonstrated an  understanding of the instructions. ?  ?The patient was advised to call back or seek an in-person evaluation if the symptoms worsen or if the condition fails to improve as anticipated. ? ?I provided 20 minutes of face-to-face time during this encounter. ? ? ?Theodis Shove, MD  ? ? ?

## 2022-02-12 ENCOUNTER — Telehealth: Payer: Self-pay

## 2022-02-12 NOTE — Telephone Encounter (Signed)
Called and lvm for pt to call back regarding scheduled appt. ?

## 2022-02-15 ENCOUNTER — Encounter: Payer: Self-pay | Admitting: Internal Medicine

## 2022-02-15 ENCOUNTER — Other Ambulatory Visit: Payer: Self-pay

## 2022-02-15 ENCOUNTER — Telehealth (INDEPENDENT_AMBULATORY_CARE_PROVIDER_SITE_OTHER): Payer: 59 | Admitting: Internal Medicine

## 2022-02-15 DIAGNOSIS — E0500 Thyrotoxicosis with diffuse goiter without thyrotoxic crisis or storm: Secondary | ICD-10-CM

## 2022-02-15 MED ORDER — METHIMAZOLE 10 MG PO TABS
20.0000 mg | ORAL_TABLET | Freq: Two times a day (BID) | ORAL | 3 refills | Status: DC
  Filled 2022-06-01 – 2022-07-05 (×2): qty 120, 30d supply, fill #0

## 2022-02-15 NOTE — Progress Notes (Signed)
Patient ID: Leah Perez, female   DOB: 1991-08-19, 31 y.o.   MRN: [id number]  Patient location: [place] My location: Office Persons participating in the virtual visit: patient, provider  Referring Provider: Wynn Banker, MD  I connected with the patient on 02/15/22 at  1:40 PM EST by a telephone enabled telemedicine application and verified that I am speaking with the correct person.   I discussed the limitations of evaluation and management by telephone and the availability of in person appointments. The patient expressed understanding and agreed to proceed.   Details of the encounter are shown below.  HPI  Leah Perez is a 31 y.o.-year-old female, initially referred by her PCP, Dr. Hassan Rowan, returning for follow-up for Graves' disease and thyroid nodules.  Last visit 11 days ago.  Interim history: She scheduled this appointment sooner to discuss about the recommended thyroid surgery. She continues to have occasional short episodes of palpitations. Her heat intolerance resolved, she now feels cold.  Reviewed and addended history: Patient mentions that she noticed that her eyes were bulging after the birth of her son in 2016.  She was found to be thyrotoxic in 2019 and saw endocrinology (Dr. Tedd Sias). She was started on MMI 10 mg daily and reportedly her TFTs were controlled until 2021 when she could not return to see Dr. Tedd Sias due to a change in insurance.  I was able to review thyroid tests from 2021 and 2022 in Care Everywhere and her TSH was undetectable while free thyroid hormones were high and she mentions that at that time she was off methimazole as she could not refill the medication.  She remains off methimazole now.  She initially felt better on methimazole, with less fatigue and better concentration.  However, after coming off methimazole, she started to have weight loss (~4-6 sizes), tremors, palpitations, heat intolerance, hair loss, hyper defecation and loss  of concentration (Adderall dose was increased).  Also, she has a history of HTN, swelling in the legs in last few years, and feels her teeth are "crumbling".  05/28/2022: We restarted methimazole at 10 mg twice a day.  02/05/2022: We increase methimazole to 20 mg twice a day and I suggested a referral to surgery for total thyroidectomy.  I reviewed pt's thyroid tests: Lab Results  Component Value Date   TSH <0.01 (L) 02/05/2022   TSH 0.00 Repeated and verified X2. (L) 12/14/2021   TSH <0.01 (L) 05/25/2021   TSH <0.01 (L) 05/17/2020   FREET4 4.42 (H) 02/05/2022   FREET4 4.60 (H) 12/14/2021   FREET4 4.25 (H) 05/25/2021   FREET4 3.85 (H) 05/17/2020   T3FREE 14.3 (H) 02/05/2022   T3FREE 19.2 (H) 12/14/2021   T3FREE 10.3 (H) 05/17/2020   Also, per review of records from [facility]: 01/23/2021: TSH <0.01, free T4 3.32,  10/27/2020: TSH <0.01, free T4 3.89 (0.66-1.14), total T3 407 (87-178) 03/08/2010: TSH 1.26  Antithyroid antibodies: Lab Results  Component Value Date   TSI 401 (H) 12/14/2021    Thyroid ultrasound on 05/26/2020 and this showed 2 small nodules: Parenchymal Echotexture: Moderately heterogenous Isthmus: 0.8 cm Right lobe: 5.6 x 2.5 x 2.7 cm Left lobe: 5.8 x 2.5 x 2.5 cm ______________________________________________ Nodule # 1: Location: Right; Mid  Maximum size: 1.6 cm; Other 2 dimensions: 1.5 x 0.7 cm  Composition: solid/almost completely solid (2)  Echogenicity: isoechoic (1)  *Given size (>/= 1.5 - 2.4 cm) and appearance, a follow-up ultrasound in 1 year should be considered based on TI-RADS criteria.  _________________________________________________________   Small spongiform nodule in the left mid gland does not meet criteria for further evaluation.   IMPRESSION: 1. 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 2. There is a solitary questionable nodule versus pseudo nodule in the right mid gland which  measures up to 1.6 cm. This abnormality meets criteria for surveillance. Recommend follow-up ultrasound in 1 year.   Pt denies: - hoarseness - dysphagia - choking - SOB with lying down  Pt does not have a FH of thyroid ds.  or autoimmune diseases.  No FH of thyroid cancer. No h/o radiation tx to head or neck. No recent contrast studies. No steroid use. No herbal supplements. No Biotin use.  Pt. also has a history of ADD, HTN, anxiety/depression.  She has no menstrual cycles-on Nexplanon.  She does not plan to have anymore children.  ROS: + See HPI  Past Medical History:  Diagnosis Date   ANXIETY 03/06/2009   DEPRESSION 03/06/2009   Depression    Migraine    NSVD (normal spontaneous vaginal delivery) 09/12/2015   PROM (premature rupture of membranes) 09/09/2015   Rh negative status during pregnancy in second trimester, antepartum 05/31/2015   [ ] Rhophylac    Thyroid disease    Past Surgical History:  Procedure Laterality Date   WISDOM TOOTH EXTRACTION     Social History   Socioeconomic History   Marital status: Single    Spouse name: Not on file   Number of children: 1   Years of education: Not on file   Highest education level: Not on file  Occupational History   Occupation: Works in affordable housing  Tobacco Use   Smoking status: Every Day    Packs/day: 0.25    Years: 10.00    Pack years: 2.50    Types: Cigarettes    Last attempt to quit: 12/10/2011    Years since quitting: 10.1   Smokeless tobacco: Never   Tobacco comments:    on occasion  Substance and Sexual Activity   Alcohol use: Yes    Alcohol/week: 1.0 standard drink    Types: 1 Glasses of wine per week    Comment: occ   Drug use: No   Sexual activity: Yes    Birth control/protection: Implant  Other Topics Concern   Not on file  Social History Narrative   ** Merged History Encounter **       Social Determinants of Health   Financial Resource Strain: Not on file  Food Insecurity: Not on  file  Transportation Needs: Not on file  Physical Activity: Not on file  Stress: Not on file  Social Connections: Not on file  Intimate Partner Violence: Not on file   Current Outpatient Medications on File Prior to Visit  Medication Sig Dispense Refill   ALPRAZolam (XANAX) 0.5 MG tablet Take 1 tablet (0.5 mg total) by mouth daily as needed for anxiety. 30 tablet 0   amphetamine-dextroamphetamine (ADDERALL XR) 30 MG 24 hr capsule Take 1 capsule (30 mg total) by mouth every morning. 90 capsule 0   amphetamine-dextroamphetamine (ADDERALL) 10 MG tablet Take 1 tablet (10 mg total) by mouth daily as needed. 90 tablet 0   atenolol (TENORMIN) 25 MG tablet Take 1 tablet (25 mg total) by mouth 2 (two) times daily. 180 tablet 3   etonogestrel (NEXPLANON) 68 MG IMPL implant 1 each by Subdermal route once.     ibuprofen (ADVIL,MOTRIN) 600 MG tablet Take 1 tablet (600 mg total)  by mouth every 6 (six) hours as needed. 30 tablet 0   methimazole (TAPAZOLE) 10 MG tablet Take 2 tablets (20 mg total) by mouth 2 (two) times daily. 120 tablet 3   sertraline (ZOLOFT) 100 MG tablet Take 1 tablet (100 mg total) by mouth daily. 90 tablet 0   sertraline (ZOLOFT) 25 MG tablet Take 1 tablet (25 mg total) by mouth daily. 90 tablet 0   tretinoin (RETIN-A) 0.025 % cream Apply topically at bedtime. 45 g 2   No current facility-administered medications on file prior to visit.   Allergies  Allergen Reactions   Doxycycline Hives and Rash   Family History  Problem Relation Age of Onset   Breast cancer Mother 41       "estrogen fed"   Cancer Mother    Diabetes Father    Obesity Father    Heart attack Father    Leukemia Father        mast cell leukemia   Cancer Father    Obesity Brother    Alcohol abuse Brother    Other Brother        ?asbergers, cog delay   Intellectual disability Brother    Cervical cancer Maternal Grandmother    Cancer Maternal Grandmother    Heart disease Maternal Grandfather         pacemaker   Leukemia Paternal Grandmother    Liver disease Brother    Cancer Maternal Uncle    PE: There were no vitals taken for this visit. Wt Readings from Last 3 Encounters:  02/05/22 137 lb (62.1 kg)  01/08/22 133 lb 11.2 oz (60.6 kg)  12/14/21 138 lb 12.8 oz (63 kg)   Constitutional:  in NAD  The physical exam was not performed (telephone visit).   ASSESSMENT: 1. Thyrotoxicosis  2.  Thyroid nodules  3.  Thyroid ophthalmopathy  PLAN:  1. Patient with a long history of thyrotoxicosis, initially diagnosed in 2019, was symptomatic since approximately 2016, after the birth of her son.  She was initially started on methimazole but then came off as she could not return to see her endocrinologist.  Her TFTs have been uncontrolled since at least 05/2020. -When I first saw her, she had tremors, weight loss, heat intolerance, hyperdefecation, palpitations, occasional tinnitus, fatigue, anxiety/depression, inability to concentrate, hair loss, pretibial myxedema, and 2 broken teeth.  She also had high blood pressure and tachycardia.  Therefore, she displayed the majority of greatest signs and symptoms. -We checked TSI antibodies and they were positive, confirming Graves' disease -2 months ago we restarted methimazole 10 mg twice a day and she started to feel much better afterwards.  At last visit, she still has some tremors and occasional palpitations but she gained weight, blood pressure was better, concentration improved, and her heat intolerance and pretibial myxedema improved.   -Surprisingly, though, her thyroid tests were not much better than before so I suggested an increase in methimazole to 20 mg twice a day but I also suggested a referral to surgery for total thyroidectomy.  The reason for this was uncontrolled hypothyroidism but also the fact that she is a smoker (smokes half a pack a day) and also she has Graves' ophthalmopathy. She has a lid lag and stare, along with exophthalmos  and mildly disconjugate gaze, but no double vision, chemosis, eye pain.  An ophthalmology appointment is pending. -She scheduled the appointment today to find out more about the surgery.  We discussed about the procedure, outcomes, the need for levothyroxine  afterwards.  She is planning to go to [place] in May with her son and was wondering whether she can have the procedure before then so that she can heal until then.  I am not sure if this is possible, it may be better for her to have it after she returns, however, I did advise her to discuss with Dr. Gerrit Friends about this. -At last visit, we also discussed about how to take levothyroxine correctly: Fasting, every day, with water, at least 30 minutes before breakfast, separated by at least 4 hours from: - acid reflux medications - calcium - iron - multivitamins -Of note, no further plans for pregnancy.  She has a 58-year-old son. -She agrees with referral to surgery.  I will refer her to Dr. Gerrit Friends. - RTC in 2 months, but I advised her to come back for labs in approximately 2-3 weeks.  2. Thyroid nodules -Patient has a history of 2 thyroid nodules seen on the neck ultrasound from 05/2020.  One of them is subcentimeter and solid and meets the recommendation for follow-up.  She also has a left spongiform appearing nodule, which we discussed is a benign feature. -We discussed at last visit that thyroid nodules can appear in the setting of inflammation from thyrotoxicosis  -No neck compression symptoms -Thyroidectomy pending.  3.  Thyroid ophthalmopathy -She has a long history of exophthalmos.  She also has a lid lag and stare. -I recommended ophthalmology referral for possibly Alveda Reasons but this is pending.  - Total time spent for the visit: 30 min (however, only 12.5 min on the phone with her), in obtaining medical information from the chart, reviewing her  previous labs, imaging evaluations, and treatments, reviewing her symptoms, counseling her  about her condition (please see the discussed topics above), and developing a plan to further investigate and treat it; she had a number of questions which I addressed.  Carlus Pavlov, MD PhD [facility] Endocr[facility]

## 2022-02-15 NOTE — Patient Instructions (Signed)
Please continue Methimazole 20 mg 2x a day. ? ?Also, continue atenolol 2x a day. ? ?Please come back for labs in 2 to 3 weeks. ? ?Please come back for a follow-up appointment in 2 months. ? ?

## 2022-02-25 MED ORDER — AMPHETAMINE-DEXTROAMPHETAMINE 10 MG PO TABS: 10.0000 mg | ORAL_TABLET | Freq: Every day | ORAL | 0 refills | Status: DC | PRN

## 2022-03-09 ENCOUNTER — Other Ambulatory Visit: Payer: Self-pay | Admitting: Family Medicine

## 2022-03-09 MED ORDER — AMPHETAMINE-DEXTROAMPHET ER 30 MG PO CP24
30.0000 mg | ORAL_CAPSULE | ORAL | 0 refills | Status: DC
  Filled 2022-03-09: qty 30, 30d supply, fill #0
  Filled 2022-05-04: qty 90, 90d supply, fill #0
  Filled 2022-05-31: qty 30, 30d supply, fill #0
  Filled 2022-07-05: qty 30, 30d supply, fill #1

## 2022-03-11 ENCOUNTER — Other Ambulatory Visit (HOSPITAL_BASED_OUTPATIENT_CLINIC_OR_DEPARTMENT_OTHER): Payer: Self-pay

## 2022-03-20 DIAGNOSIS — E0500 Thyrotoxicosis with diffuse goiter without thyrotoxic crisis or storm: Secondary | ICD-10-CM | POA: Diagnosis not present

## 2022-03-20 DIAGNOSIS — E041 Nontoxic single thyroid nodule: Secondary | ICD-10-CM | POA: Diagnosis not present

## 2022-03-22 ENCOUNTER — Other Ambulatory Visit (HOSPITAL_BASED_OUTPATIENT_CLINIC_OR_DEPARTMENT_OTHER): Payer: Self-pay

## 2022-05-04 ENCOUNTER — Other Ambulatory Visit (HOSPITAL_COMMUNITY): Payer: Self-pay

## 2022-05-04 ENCOUNTER — Other Ambulatory Visit: Payer: Self-pay | Admitting: Family Medicine

## 2022-05-04 DIAGNOSIS — F411 Generalized anxiety disorder: Secondary | ICD-10-CM

## 2022-05-07 ENCOUNTER — Other Ambulatory Visit (HOSPITAL_COMMUNITY): Payer: Self-pay

## 2022-05-07 MED ORDER — SERTRALINE HCL 100 MG PO TABS
100.0000 mg | ORAL_TABLET | Freq: Every day | ORAL | 0 refills | Status: DC
  Filled 2022-05-07 – 2022-06-01 (×3): qty 30, 30d supply, fill #0
  Filled 2022-07-05: qty 30, 30d supply, fill #1
  Filled 2022-09-06: qty 30, 30d supply, fill #2

## 2022-05-07 MED ORDER — ALPRAZOLAM 0.5 MG PO TABS
0.5000 mg | ORAL_TABLET | Freq: Every day | ORAL | 0 refills | Status: DC | PRN
  Filled 2022-05-07 – 2022-07-05 (×3): qty 30, 30d supply, fill #0

## 2022-05-07 MED ORDER — AMPHETAMINE-DEXTROAMPHETAMINE 10 MG PO TABS
10.0000 mg | ORAL_TABLET | Freq: Every day | ORAL | 0 refills | Status: DC | PRN
  Filled 2022-05-07 – 2022-07-05 (×3): qty 30, 30d supply, fill #0

## 2022-05-07 MED ORDER — SERTRALINE HCL 25 MG PO TABS
25.0000 mg | ORAL_TABLET | Freq: Every day | ORAL | 0 refills | Status: DC
  Filled 2022-05-07 – 2022-07-05 (×3): qty 30, 30d supply, fill #0
  Filled 2022-09-06: qty 30, 30d supply, fill #1
  Filled 2022-11-04: qty 30, 30d supply, fill #2

## 2022-05-15 ENCOUNTER — Other Ambulatory Visit (HOSPITAL_COMMUNITY): Payer: Self-pay

## 2022-05-17 ENCOUNTER — Ambulatory Visit: Payer: 59 | Admitting: Internal Medicine

## 2022-05-31 ENCOUNTER — Other Ambulatory Visit (HOSPITAL_COMMUNITY): Payer: Self-pay

## 2022-05-31 ENCOUNTER — Other Ambulatory Visit (HOSPITAL_BASED_OUTPATIENT_CLINIC_OR_DEPARTMENT_OTHER): Payer: Self-pay

## 2022-06-01 ENCOUNTER — Other Ambulatory Visit (HOSPITAL_COMMUNITY): Payer: Self-pay

## 2022-06-07 ENCOUNTER — Other Ambulatory Visit (HOSPITAL_COMMUNITY): Payer: Self-pay

## 2022-06-15 ENCOUNTER — Other Ambulatory Visit (HOSPITAL_COMMUNITY): Payer: Self-pay

## 2022-06-22 ENCOUNTER — Other Ambulatory Visit (HOSPITAL_COMMUNITY): Payer: Self-pay

## 2022-07-05 ENCOUNTER — Other Ambulatory Visit (HOSPITAL_COMMUNITY): Payer: Self-pay

## 2022-07-11 ENCOUNTER — Other Ambulatory Visit (HOSPITAL_COMMUNITY): Payer: Self-pay

## 2022-07-12 ENCOUNTER — Other Ambulatory Visit (HOSPITAL_COMMUNITY): Payer: Self-pay

## 2022-07-25 ENCOUNTER — Other Ambulatory Visit (HOSPITAL_COMMUNITY): Payer: Self-pay

## 2022-08-05 ENCOUNTER — Telehealth: Payer: Self-pay | Admitting: Family Medicine

## 2022-08-05 ENCOUNTER — Other Ambulatory Visit (HOSPITAL_BASED_OUTPATIENT_CLINIC_OR_DEPARTMENT_OTHER): Payer: Self-pay

## 2022-08-05 NOTE — Telephone Encounter (Signed)
Requesting refill of amphetamine-dextroamphetamine (ADDERALL XR) 30 MG 24 hr capsule insurance will only pay for 30d   [facility] Phone:  [phone]  Fax:  [phone]

## 2022-08-06 ENCOUNTER — Other Ambulatory Visit: Payer: Self-pay

## 2022-08-06 ENCOUNTER — Other Ambulatory Visit: Payer: Self-pay | Admitting: Family Medicine

## 2022-08-06 ENCOUNTER — Other Ambulatory Visit (HOSPITAL_COMMUNITY): Payer: Self-pay

## 2022-08-06 DIAGNOSIS — F900 Attention-deficit hyperactivity disorder, predominantly inattentive type: Secondary | ICD-10-CM

## 2022-08-06 MED ORDER — AMPHETAMINE-DEXTROAMPHET ER 30 MG PO CP24: 30.0000 mg | ORAL_CAPSULE | ORAL | 0 refills | Status: DC

## 2022-08-06 MED ORDER — AMPHETAMINE-DEXTROAMPHET ER 30 MG PO CP24
30.0000 mg | ORAL_CAPSULE | ORAL | 0 refills | Status: DC
  Filled 2022-08-06 – 2022-09-06 (×2): qty 30, 30d supply, fill #0

## 2022-08-06 NOTE — Telephone Encounter (Signed)
Pt called back and states the original pharmacy is out and this prescription needs to now go to  [facility] Phone:  [phone]  Fax:  [phone]

## 2022-08-06 NOTE — Telephone Encounter (Signed)
Rx sent until her appt with me

## 2022-08-06 NOTE — Telephone Encounter (Signed)
Ok I switched the pharmacy and sent it

## 2022-08-06 NOTE — Telephone Encounter (Signed)
Patient informed of the message below.

## 2022-08-09 ENCOUNTER — Other Ambulatory Visit: Payer: Self-pay | Admitting: Internal Medicine

## 2022-09-03 ENCOUNTER — Encounter: Payer: 59 | Admitting: Family Medicine

## 2022-09-06 ENCOUNTER — Other Ambulatory Visit: Payer: Self-pay | Admitting: Family

## 2022-09-06 ENCOUNTER — Other Ambulatory Visit (HOSPITAL_COMMUNITY): Payer: Self-pay

## 2022-09-06 DIAGNOSIS — F411 Generalized anxiety disorder: Secondary | ICD-10-CM

## 2022-09-13 ENCOUNTER — Other Ambulatory Visit: Payer: Self-pay | Admitting: Family

## 2022-09-13 ENCOUNTER — Other Ambulatory Visit (HOSPITAL_COMMUNITY): Payer: Self-pay

## 2022-09-13 DIAGNOSIS — F411 Generalized anxiety disorder: Secondary | ICD-10-CM

## 2022-10-28 ENCOUNTER — Other Ambulatory Visit (HOSPITAL_COMMUNITY): Payer: Self-pay

## 2022-11-04 ENCOUNTER — Other Ambulatory Visit: Payer: Self-pay | Admitting: Family Medicine

## 2022-11-04 ENCOUNTER — Other Ambulatory Visit (HOSPITAL_COMMUNITY): Payer: Self-pay

## 2022-11-04 DIAGNOSIS — F900 Attention-deficit hyperactivity disorder, predominantly inattentive type: Secondary | ICD-10-CM

## 2022-11-07 ENCOUNTER — Other Ambulatory Visit (INDEPENDENT_AMBULATORY_CARE_PROVIDER_SITE_OTHER): Payer: 59

## 2022-11-07 ENCOUNTER — Telehealth: Payer: Self-pay | Admitting: Family Medicine

## 2022-11-07 ENCOUNTER — Other Ambulatory Visit (HOSPITAL_COMMUNITY): Payer: Self-pay

## 2022-11-07 ENCOUNTER — Encounter: Payer: Self-pay | Admitting: Family Medicine

## 2022-11-07 ENCOUNTER — Encounter: Payer: 59 | Admitting: Family Medicine

## 2022-11-07 ENCOUNTER — Ambulatory Visit (INDEPENDENT_AMBULATORY_CARE_PROVIDER_SITE_OTHER): Payer: 59 | Admitting: Family Medicine

## 2022-11-07 VITALS — BP 130/70 | HR 100 | Temp 98.3°F | Ht 64.0 in | Wt 144.5 lb

## 2022-11-07 DIAGNOSIS — L709 Acne, unspecified: Secondary | ICD-10-CM

## 2022-11-07 DIAGNOSIS — Z13220 Encounter for screening for lipoid disorders: Secondary | ICD-10-CM

## 2022-11-07 DIAGNOSIS — H0266 Xanthelasma of left eye, unspecified eyelid: Secondary | ICD-10-CM

## 2022-11-07 DIAGNOSIS — F411 Generalized anxiety disorder: Secondary | ICD-10-CM

## 2022-11-07 DIAGNOSIS — F900 Attention-deficit hyperactivity disorder, predominantly inattentive type: Secondary | ICD-10-CM | POA: Diagnosis not present

## 2022-11-07 DIAGNOSIS — E0590 Thyrotoxicosis, unspecified without thyrotoxic crisis or storm: Secondary | ICD-10-CM

## 2022-11-07 DIAGNOSIS — H0263 Xanthelasma of right eye, unspecified eyelid: Secondary | ICD-10-CM

## 2022-11-07 LAB — T4, FREE: Free T4: 4.21 ng/dL — ABNORMAL HIGH (ref 0.60–1.60)

## 2022-11-07 LAB — LIPID PANEL
Cholesterol: 133 mg/dL (ref 0–200)
HDL: 40.1 mg/dL (ref >39.00)
LDL Cholesterol: 64 mg/dL (ref 0–99)
NonHDL: 92.78
Total CHOL/HDL Ratio: 3
Triglycerides: 143 mg/dL (ref 0.0–149.0)
VLDL: 28.6 mg/dL (ref 0.0–40.0)

## 2022-11-07 LAB — T3, FREE: T3, Free: 13.4 pg/mL — ABNORMAL HIGH (ref 2.3–4.2)

## 2022-11-07 LAB — TSH: TSH: 0 u[IU]/mL — ABNORMAL LOW (ref 0.35–5.50)

## 2022-11-07 MED ORDER — AMPHETAMINE-DEXTROAMPHET ER 30 MG PO CP24
30.0000 mg | ORAL_CAPSULE | Freq: Every morning | ORAL | 0 refills | Status: DC
  Filled 2022-12-20 – 2022-12-24 (×2): qty 30, 30d supply, fill #0

## 2022-11-07 MED ORDER — AMPHETAMINE-DEXTROAMPHETAMINE 10 MG PO TABS
10.0000 mg | ORAL_TABLET | Freq: Every day | ORAL | 0 refills | Status: DC | PRN
  Filled 2022-11-07: qty 30, 30d supply, fill #0

## 2022-11-07 MED ORDER — SERTRALINE HCL 100 MG PO TABS
100.0000 mg | ORAL_TABLET | Freq: Every day | ORAL | 3 refills | Status: DC
  Filled 2022-11-07: qty 30, 30d supply, fill #0
  Filled 2022-12-20: qty 30, 30d supply, fill #1
  Filled 2023-03-01 – 2023-04-04 (×3): qty 30, 30d supply, fill #2
  Filled 2023-06-18 – 2023-06-26 (×2): qty 30, 30d supply, fill #3
  Filled 2023-07-21 – 2023-08-09 (×2): qty 30, 30d supply, fill #4

## 2022-11-07 MED ORDER — SERTRALINE HCL 25 MG PO TABS
25.0000 mg | ORAL_TABLET | Freq: Every day | ORAL | 3 refills | Status: DC
  Filled 2022-12-20: qty 30, 30d supply, fill #0
  Filled 2023-03-01 – 2023-04-04 (×3): qty 30, 30d supply, fill #1
  Filled 2023-06-18: qty 30, 30d supply, fill #2

## 2022-11-07 MED ORDER — TRETINOIN 0.025 % EX CREA
TOPICAL_CREAM | Freq: Every day | CUTANEOUS | 2 refills | Status: DC
  Filled 2022-11-07: qty 20, 30d supply, fill #0
  Filled 2022-12-20: qty 20, 30d supply, fill #1
  Filled 2023-03-01: qty 20, 30d supply, fill #2
  Filled 2023-03-25: qty 20, 15d supply, fill #2
  Filled 2023-04-04: qty 20, 30d supply, fill #2
  Filled 2023-05-29: qty 20, 10d supply, fill #3
  Filled 2023-06-18: qty 20, 10d supply, fill #4
  Filled 2023-07-21: qty 20, 10d supply, fill #5

## 2022-11-07 MED ORDER — AMPHETAMINE-DEXTROAMPHET ER 30 MG PO CP24
30.0000 mg | ORAL_CAPSULE | ORAL | 0 refills | Status: DC
  Filled 2022-11-07: qty 30, 30d supply, fill #0

## 2022-11-07 MED ORDER — AMPHETAMINE-DEXTROAMPHET ER 30 MG PO CP24
30.0000 mg | ORAL_CAPSULE | ORAL | 0 refills | Status: DC
  Filled 2023-03-01 – 2023-04-04 (×3): qty 30, 30d supply, fill #0

## 2022-11-07 MED ORDER — ALPRAZOLAM 0.5 MG PO TABS
0.5000 mg | ORAL_TABLET | Freq: Every day | ORAL | 0 refills | Status: DC | PRN
  Filled 2022-11-07: qty 30, 30d supply, fill #0

## 2022-11-07 NOTE — Telephone Encounter (Addendum)
Leah Perez is calling and the lab needs to be future so that Leah Perez can process the blood

## 2022-11-07 NOTE — Assessment & Plan Note (Signed)
Symptoms are stable on the current dosage of 30 mg long acting daily and the 10 mg afternoon dose as needed daily. Will refill these for the patient.

## 2022-11-07 NOTE — Assessment & Plan Note (Signed)
Was unable to follow up with her endocrinologist, I will order her TSH, free T4 and Free T3 and send the results to Dr. Elvera Lennox.   I strongly advised her to make an appointment to follow up.

## 2022-11-07 NOTE — Progress Notes (Signed)
Established Patient Office Visit  Subjective   Patient ID: Leah Perez, female    DOB: 06/18/1991  Age: 31 y.o. MRN: [id number]  Chief Complaint  Patient presents with   Establish Care    Patient is here for transition of care visit and medication refills. 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 Has a diagnosis of ADHD and states that her adderall is working well at the current dose. She only occasionally takes the afternoon dose, sometimes only takes 5 mg as needed in the afternoon.   Anxiety/depression-- pt is on 125 mg total daily of sertraline. She reports that she is only taking 1/2 tablet of the xanax as needed for additional anxiety, states that she takes it only at night, usually to help her sleep. We had a long conversation about the long term use of benzodiazepines and the high risk nature of this medication. Pt states that she used to be on the medication more frequently and has weaned herself down to as needed. States that 30 tablets usually last about 3 months for her. Needs refills today.   Grave's disease-- pt has not seen her endocrinologist in some time, last labs were done in January 2023. Pt reports she is on 20 mg BID of methimazole. I reviewed her labs from January 2023, pt reports she hasn't had blood work done since then . She reports the cholesterol deposits around her eyes have not improved,I reviewed her last lipid panel which was WNL.    Current Outpatient Medications  Medication Instructions   ALPRAZolam (XANAX) 0.5 mg, Oral, Daily PRN   amphetamine-dextroamphetamine (ADDERALL XR) 30 MG 24 hr capsule 30 mg, Oral, BH-each morning   [START ON 12/07/2022] amphetamine-dextroamphetamine (ADDERALL XR) 30 MG 24 hr capsule 30 mg, Oral, BH-each morning   [START ON 01/06/2023] amphetamine-dextroamphetamine (ADDERALL XR) 30 MG 24 hr capsule 30 mg, Oral, BH-each morning   amphetamine-dextroamphetamine  (ADDERALL) 10 MG tablet 10 mg, Oral, Daily PRN   atenolol (TENORMIN) 25 mg, Oral, 2 times daily   etonogestrel (NEXPLANON) 68 MG IMPL implant 1 each, Subdermal,  Once   methimazole (TAPAZOLE) 10 mg, Oral, 2 times daily   sertraline (ZOLOFT) 100 MG tablet Take 1 tablet by mouth daily. (Patient needs appointment)   sertraline (ZOLOFT) 25 MG tablet Take 1 tablet by mouth daily with 100mg  tablet. (Patient needs appointment)   tretinoin (RETIN-A) 0.025 % cream Topical, Daily at bedtime    Patient Active Problem List   Diagnosis Date Noted   Thyroid ophthalmopathy 12/14/2021   Hyperthyroidism 05/25/2021   ADHD (attention deficit hyperactivity disorder) 10/08/2012   History of migraine headaches 08/07/2011   Anxiety state 03/06/2009   DEPRESSION 03/06/2009      Review of Systems  Constitutional:  Negative for weight loss.  Gastrointestinal:  Negative for constipation and diarrhea.  Psychiatric/Behavioral:  The patient is nervous/anxious and has insomnia.   All other systems reviewed and are negative.     Objective:     BP 130/70 (BP Location: Right Arm, Patient Position: Sitting, Cuff Size: Normal)   Pulse 100   Temp 98.3 F (36.8 C) (Oral)   Ht 5\' 4"  (1.626 m)   Wt 144 lb 8 oz (65.5 kg)   SpO2 97%   BMI 24.80 kg/m    Physical Exam Vitals reviewed.  Constitutional:      Appearance: Normal appearance. She is well-groomed and normal weight.  Eyes:  Conjunctiva/sclera: Conjunctivae normal.  Neck:     Thyroid: No thyromegaly.  Cardiovascular:     Rate and Rhythm: Normal rate and regular rhythm.     Pulses: Normal pulses.     Heart sounds: S1 normal and S2 normal.  Pulmonary:     Effort: Pulmonary effort is normal.     Breath sounds: Normal breath sounds and air entry.  Abdominal:     General: Bowel sounds are normal.  Musculoskeletal:     Right lower leg: No edema.     Left lower leg: No edema.  Neurological:     Mental Status: She is alert and oriented to  person, place, and time. Mental status is at baseline.     Gait: Gait is intact.  Psychiatric:        Mood and Affect: Mood and affect normal.        Speech: Speech normal.        Behavior: Behavior normal.        Judgment: Judgment normal.        Last lipids Lab Results  Component Value Date   CHOL 133 11/07/2022   HDL 40.10 11/07/2022   LDLCALC 64 11/07/2022   TRIG 143.0 11/07/2022   CHOLHDL 3 11/07/2022   Last thyroid functions Lab Results  Component Value Date   TSH 0.00 (L) 11/07/2022      The ASCVD Risk score (Arnett DK, et al., 2019) failed to calculate for the following reasons:   The 2019 ASCVD risk score is only valid for ages 48 to 63    Assessment & Plan:   Problem List Items Addressed This Visit       Unprioritized   Anxiety state - Primary    Patient's thyroid condition is likely still uncontrolled, which is exaerbating her anxiety and sleep issues. We discussed that xanax is high risk and should not be used long term for treatment of anxiety or insomnia. I believe that her anxiety is exacerbated by uncontrolled hyperthyroidism and that once her thyroid is under control she may potentially be weaned off this medication to a different one. Will refill her sertraline 100 mg and 25 mg tablets daily.       Relevant Medications   sertraline (ZOLOFT) 100 MG tablet   sertraline (ZOLOFT) 25 MG tablet   ALPRAZolam (XANAX) 0.5 MG tablet   ADHD (attention deficit hyperactivity disorder)    Symptoms are stable on the current dosage of 30 mg long acting daily and the 10 mg afternoon dose as needed daily. Will refill these for the patient.       Relevant Medications   amphetamine-dextroamphetamine (ADDERALL XR) 30 MG 24 hr capsule   amphetamine-dextroamphetamine (ADDERALL XR) 30 MG 24 hr capsule (Start on 12/07/2022)   amphetamine-dextroamphetamine (ADDERALL XR) 30 MG 24 hr capsule (Start on 01/06/2023)   amphetamine-dextroamphetamine (ADDERALL) 10 MG tablet    Hyperthyroidism    Was unable to follow up with her endocrinologist, I will order her TSH, free T4 and Free T3 and send the results to Dr. Elvera Lennox.   I strongly advised her to make an appointment to follow up.      Relevant Orders   T3, Free (Completed)   T4, Free (Completed)   TSH (Completed)   Other Visit Diagnoses     Acne, unspecified acne type       Relevant Medications   Doing well on the retin A cream BID, will continue this medication, no acne visualized today on exam.  tretinoin (RETIN-A) 0.025 % cream   Lipid screening       Relevant Orders   Lipid Panel (Completed)   Xanthelasma of eyelid, bilateral       Relevant Orders   Secondary to thyroid disease, will send to dermatology -- patient's lipid panel was normal at the last check, will repeat this test today also. I am not sure if statin is recommended at this time because her LDL was <100.  Ambulatory referral to Dermatology       Return in about 3 months (around 02/06/2023) for video visit for medicatoin refills.    Karie Georges, MD

## 2022-11-07 NOTE — Assessment & Plan Note (Addendum)
Patient's thyroid condition is likely still uncontrolled, which is exaerbating her anxiety and sleep issues. We discussed that xanax is high risk and should not be used long term for treatment of anxiety or insomnia. I believe that her anxiety is exacerbated by uncontrolled hyperthyroidism and that once her thyroid is under control she may potentially be weaned off this medication to a different one. Will refill her sertraline 100 mg and 25 mg tablets daily.

## 2022-11-15 ENCOUNTER — Ambulatory Visit (INDEPENDENT_AMBULATORY_CARE_PROVIDER_SITE_OTHER): Payer: 59 | Admitting: Family Medicine

## 2022-11-15 ENCOUNTER — Encounter: Payer: Self-pay | Admitting: Family Medicine

## 2022-11-15 VITALS — BP 96/60 | HR 110 | Temp 98.4°F | Ht 64.0 in | Wt 143.5 lb

## 2022-11-15 DIAGNOSIS — L601 Onycholysis: Secondary | ICD-10-CM | POA: Diagnosis not present

## 2022-11-18 DIAGNOSIS — L601 Onycholysis: Secondary | ICD-10-CM | POA: Insufficient documentation

## 2022-11-18 NOTE — Assessment & Plan Note (Signed)
Was unable to remove the nail in total, the majority was cut away and 1 small sliver was left that was still attached to the nail bed. I advised she continue wrapping the nail with a bandaid to avoid snagging the small piece left. Reassured patient that the nail will eventually grow back, it just needs time. There is no evidence of infection or other issues at this time.

## 2022-11-18 NOTE — Progress Notes (Signed)
   Acute Office Visit  Subjective:     Patient ID: Leah Perez, female    DOB: [birth year], 31 y.o.   MRN: [id number]  Chief Complaint  Patient presents with   Hand Pain    Patient states she hit her right 4th digit onto her thigh 2 weeks ago and broke the nail, states she noticed the cuticle was injured also, received a manicure 3 weeks ago    HPI Patient is in today for her nail on the right 4th digit. She reports that she was wearing an acrylic nail and 2 weeks ago caught it on her jeans. The nail "ripped" separating it fron the nail bed, but it has not come off completely. She reports no redness, no drainage or pus from the nail bed. States she was wrapping it in a bandaid to protect the nail.  Review of Systems  Constitutional:  Negative for chills and fever.  All other systems reviewed and are negative.       Objective:    BP 96/60 (BP Location: Left Arm, Patient Position: Sitting, Cuff Size: Normal)   Pulse (!) 110   Temp 98.4 F (36.9 C) (Oral)   Ht 5\' 4"  (1.626 m)   Wt 143 lb 8 oz (65.1 kg)   SpO2 99%   BMI 24.63 kg/m    Physical Exam Vitals reviewed.  Constitutional:      Appearance: Normal appearance. She is normal weight.  Skin:    Comments: Right fourth digit nail has dislodge itself however it still attached to the nail bed in the lateral corner. I applied hemostats and attempted to remove the nail in total however this was unsuccessful. So I cut the nail that was already off the nail bed and left a small sliver of nail attached to the nail bed.   Neurological:     Mental Status: She is alert.     No results found for any visits on 11/15/22.      Assessment & Plan:   Problem List Items Addressed This Visit       Unprioritized   Traumatic onycholysis - Primary    Was unable to remove the nail in total, the majority was cut away and 1 small sliver was left that was still attached to the nail bed. I advised she continue wrapping the nail with a  bandaid to avoid snagging the small piece left. Reassured patient that the nail will eventually grow back, it just needs time. There is no evidence of infection or other issues at this time.       No orders of the defined types were placed in this encounter.   No follow-ups on file.  14/08/23, MD

## 2022-11-27 ENCOUNTER — Telehealth: Payer: Self-pay

## 2022-11-27 NOTE — Telephone Encounter (Signed)
  Provider stated she received a message from patient's PCP about still very abnormal thyroid labs.  She does not have a coming up appointment with me.  Can you please see if she wants to schedule one?  In the meantime, has she been taking methimazole as advised?  If not, can we send a prescription for her to the pharmacy of choice and please ask her to start taking it?  Thank you!  C  Attempted to contact pt to schedule follow up appt with provider.

## 2022-12-20 ENCOUNTER — Encounter: Payer: Self-pay | Admitting: Family Medicine

## 2022-12-20 ENCOUNTER — Other Ambulatory Visit: Payer: Self-pay | Admitting: Family Medicine

## 2022-12-20 DIAGNOSIS — F900 Attention-deficit hyperactivity disorder, predominantly inattentive type: Secondary | ICD-10-CM

## 2022-12-21 ENCOUNTER — Other Ambulatory Visit (HOSPITAL_COMMUNITY): Payer: Self-pay

## 2022-12-23 ENCOUNTER — Other Ambulatory Visit (HOSPITAL_COMMUNITY): Payer: Self-pay

## 2022-12-23 MED ORDER — AMPHETAMINE-DEXTROAMPHETAMINE 10 MG PO TABS
10.0000 mg | ORAL_TABLET | Freq: Every day | ORAL | 0 refills | Status: DC | PRN
  Filled 2022-12-23: qty 30, 30d supply, fill #0

## 2022-12-24 ENCOUNTER — Encounter: Payer: Self-pay | Admitting: Family Medicine

## 2022-12-24 ENCOUNTER — Ambulatory Visit (INDEPENDENT_AMBULATORY_CARE_PROVIDER_SITE_OTHER): Payer: 59 | Admitting: Family Medicine

## 2022-12-24 ENCOUNTER — Other Ambulatory Visit (HOSPITAL_COMMUNITY): Payer: Self-pay

## 2022-12-24 ENCOUNTER — Other Ambulatory Visit: Payer: Self-pay

## 2022-12-24 VITALS — BP 120/70 | HR 111 | Temp 98.4°F | Ht 64.0 in | Wt 143.7 lb

## 2022-12-24 DIAGNOSIS — J028 Acute pharyngitis due to other specified organisms: Secondary | ICD-10-CM | POA: Diagnosis not present

## 2022-12-24 DIAGNOSIS — J020 Streptococcal pharyngitis: Secondary | ICD-10-CM

## 2022-12-24 LAB — POCT RAPID STREP A (OFFICE): Rapid Strep A Screen: POSITIVE — AB

## 2022-12-24 MED ORDER — AMOXICILLIN 500 MG PO CAPS
500.0000 mg | ORAL_CAPSULE | Freq: Two times a day (BID) | ORAL | 0 refills | Status: AC
  Filled 2022-12-24: qty 20, 10d supply, fill #0

## 2022-12-24 NOTE — Progress Notes (Signed)
Established Patient Office Visit  Subjective   Patient ID: Leah Perez, female    DOB: 04-11-1991  Age: 32 y.o. MRN: [id number]  Chief Complaint  Patient presents with   Cough    Productive with gray sputum x3 weeks, tried Mucinex initially with relief, states she used her son's nebulizer with relief    Pt is reporting 3 week history of cough, states that she did have positive sick contacts, states her son had similar symptoms as well as her sister and brother, states her son was tested and was negative for COVID. She has had some difficulty breathing, states she used her son's nebulizer and it helped, no chest pain, states its worse in the morning and worse when laying down.  States it is productive in the morning but not during the day-- more dry.   Cough The cough is Productive of sputum. Pertinent negatives include no chest pain, chills, fever, sore throat or shortness of breath. She has tried OTC cough suppressant for the symptoms. The treatment provided mild relief.    Patient Active Problem List   Diagnosis Date Noted   Traumatic onycholysis 11/18/2022   Thyroid ophthalmopathy 12/14/2021   Hyperthyroidism 05/25/2021   ADHD (attention deficit hyperactivity disorder) 10/08/2012   History of migraine headaches 08/07/2011   Anxiety state 03/06/2009   DEPRESSION 03/06/2009      Review of Systems  Constitutional:  Negative for chills and fever.  HENT:  Negative for sore throat.   Respiratory:  Positive for cough. Negative for shortness of breath.   Cardiovascular:  Negative for chest pain.  All other systems reviewed and are negative.     Objective:     BP 120/70 (BP Location: Left Arm, Patient Position: Sitting, Cuff Size: Normal)   Pulse (!) 111   Temp 98.4 F (36.9 C) (Oral)   Ht 5\' 4"  (1.626 m)   Wt 143 lb 11.2 oz (65.2 kg)   SpO2 97%   BMI 24.67 kg/m    Physical Exam Constitutional:      Appearance: Normal appearance. She is normal weight.  HENT:      Right Ear: Tympanic membrane normal.     Left Ear: Tympanic membrane normal.     Nose: No congestion or rhinorrhea.     Mouth/Throat:     Pharynx: Posterior oropharyngeal erythema present.  Eyes:     General: Scleral icterus: amoxcillin.     Conjunctiva/sclera: Conjunctivae normal.  Cardiovascular:     Rate and Rhythm: Normal rate and regular rhythm.     Heart sounds: Normal heart sounds.  Pulmonary:     Effort: Pulmonary effort is normal.     Breath sounds: Normal breath sounds. No wheezing or rales.  Neurological:     Mental Status: She is alert and oriented to person, place, and time. Mental status is at baseline.      Results for orders placed or performed in visit on 12/24/22  POC Rapid Strep A  Result Value Ref Range   Rapid Strep A Screen Positive (A) Negative      The ASCVD Risk score (Arnett DK, et al., 2019) failed to calculate for the following reasons:   The 2019 ASCVD risk score is only valid for ages 45 to 14    Assessment & Plan:   Problem List Items Addressed This Visit   None Visit Diagnoses     Pharyngitis due to other organism    -  Primary   Relevant Orders  POC Rapid Strep A (Completed)   Strep pharyngitis       Relevant Medications   amoxicillin (AMOXIL) 500 MG capsule     Strep test is positive, this is most likely setting off her cough reflex, will treat with amoxicillin 500 mg BID for 10 days. Pt will continue using OTC medications for symptom management.  No follow-ups on file.    Farrel Conners, MD

## 2023-02-06 ENCOUNTER — Telehealth (INDEPENDENT_AMBULATORY_CARE_PROVIDER_SITE_OTHER): Payer: Self-pay | Admitting: Family Medicine

## 2023-02-06 DIAGNOSIS — F900 Attention-deficit hyperactivity disorder, predominantly inattentive type: Secondary | ICD-10-CM

## 2023-02-06 NOTE — Progress Notes (Signed)
Pt did not answer phone, did not appear for the video visit.

## 2023-03-01 ENCOUNTER — Encounter: Payer: Self-pay | Admitting: Family Medicine

## 2023-03-01 ENCOUNTER — Other Ambulatory Visit: Payer: Self-pay | Admitting: Family Medicine

## 2023-03-01 ENCOUNTER — Other Ambulatory Visit (HOSPITAL_COMMUNITY): Payer: Self-pay

## 2023-03-01 DIAGNOSIS — F900 Attention-deficit hyperactivity disorder, predominantly inattentive type: Secondary | ICD-10-CM

## 2023-03-01 DIAGNOSIS — F411 Generalized anxiety disorder: Secondary | ICD-10-CM

## 2023-03-03 ENCOUNTER — Other Ambulatory Visit (HOSPITAL_COMMUNITY): Payer: Self-pay

## 2023-03-03 MED ORDER — ALPRAZOLAM 0.5 MG PO TABS
0.5000 mg | ORAL_TABLET | Freq: Every day | ORAL | 0 refills | Status: DC | PRN
  Filled 2023-03-03 – 2023-04-04 (×3): qty 30, 30d supply, fill #0

## 2023-03-03 MED ORDER — AMPHETAMINE-DEXTROAMPHETAMINE 10 MG PO TABS
10.0000 mg | ORAL_TABLET | Freq: Every day | ORAL | 0 refills | Status: DC | PRN
  Filled 2023-03-03 – 2023-04-04 (×3): qty 30, 30d supply, fill #0

## 2023-03-07 ENCOUNTER — Other Ambulatory Visit: Payer: Self-pay

## 2023-03-07 ENCOUNTER — Other Ambulatory Visit (HOSPITAL_COMMUNITY): Payer: Self-pay

## 2023-03-10 ENCOUNTER — Other Ambulatory Visit (HOSPITAL_COMMUNITY): Payer: Self-pay

## 2023-03-10 ENCOUNTER — Other Ambulatory Visit: Payer: Self-pay

## 2023-03-17 ENCOUNTER — Other Ambulatory Visit (HOSPITAL_COMMUNITY): Payer: Self-pay

## 2023-03-25 ENCOUNTER — Other Ambulatory Visit: Payer: Self-pay | Admitting: Family Medicine

## 2023-03-25 DIAGNOSIS — F900 Attention-deficit hyperactivity disorder, predominantly inattentive type: Secondary | ICD-10-CM

## 2023-03-26 ENCOUNTER — Other Ambulatory Visit: Payer: Self-pay

## 2023-03-26 ENCOUNTER — Other Ambulatory Visit (HOSPITAL_COMMUNITY): Payer: Self-pay

## 2023-03-26 MED ORDER — AMPHETAMINE-DEXTROAMPHET ER 30 MG PO CP24
30.0000 mg | ORAL_CAPSULE | Freq: Every morning | ORAL | 0 refills | Status: DC
  Filled 2023-03-26: qty 30, 30d supply, fill #0

## 2023-04-01 ENCOUNTER — Other Ambulatory Visit: Payer: Self-pay

## 2023-04-03 ENCOUNTER — Other Ambulatory Visit: Payer: Self-pay

## 2023-04-03 ENCOUNTER — Other Ambulatory Visit (HOSPITAL_COMMUNITY): Payer: Self-pay

## 2023-04-04 ENCOUNTER — Other Ambulatory Visit (HOSPITAL_COMMUNITY): Payer: Self-pay

## 2023-04-04 ENCOUNTER — Other Ambulatory Visit: Payer: Self-pay

## 2023-04-05 ENCOUNTER — Other Ambulatory Visit (HOSPITAL_COMMUNITY): Payer: Self-pay

## 2023-05-09 ENCOUNTER — Ambulatory Visit (INDEPENDENT_AMBULATORY_CARE_PROVIDER_SITE_OTHER): Payer: 59 | Admitting: Family Medicine

## 2023-05-09 ENCOUNTER — Other Ambulatory Visit (HOSPITAL_COMMUNITY): Payer: Self-pay

## 2023-05-09 ENCOUNTER — Encounter: Payer: Self-pay | Admitting: Family Medicine

## 2023-05-09 VITALS — BP 138/72 | HR 106 | Temp 98.6°F | Wt 145.6 lb

## 2023-05-09 DIAGNOSIS — M25512 Pain in left shoulder: Secondary | ICD-10-CM | POA: Diagnosis not present

## 2023-05-09 DIAGNOSIS — M542 Cervicalgia: Secondary | ICD-10-CM | POA: Diagnosis not present

## 2023-05-09 DIAGNOSIS — S46812A Strain of other muscles, fascia and tendons at shoulder and upper arm level, left arm, initial encounter: Secondary | ICD-10-CM

## 2023-05-09 DIAGNOSIS — M62838 Other muscle spasm: Secondary | ICD-10-CM

## 2023-05-09 MED ORDER — CYCLOBENZAPRINE HCL 5 MG PO TABS
2.5000 mg | ORAL_TABLET | Freq: Every evening | ORAL | 0 refills | Status: DC | PRN
  Filled 2023-05-09: qty 10, 20d supply, fill #0

## 2023-05-09 NOTE — Progress Notes (Signed)
Established Patient Office Visit   Subjective  Patient ID: Leah Perez, female    DOB: 23-Nov-1991  Age: 32 y.o. MRN: [id number]  Chief Complaint  Patient presents with   Shoulder Pain    Moved a desk, has been having pain in shoulder blade area. Can lift arm about halfway up but when goes further it is painful. Started on Wednesday   Pt accompanied by her son.  Patient is a 32 year old female followed by Dr. Casimiro Needle seen for acute concern.  Patient endorses pushing a heavy desk 2 days ago.  A few hours later patient developed left shoulder/neck discomfort.  Has difficulty describing sensation but states it is an "uncomfortable feeling, dull, a pulling sensation".  Patient tried ice, heat, Tylenol, ibuprofen with some relief.  Has discomfort with turning head to left side.  Denies decreased ROM, numbness, tingling of LUE.  Shoulder Pain       ROS Negative unless stated above    Objective:     BP (!) 148/76 (BP Location: Right Arm, Patient Position: Sitting, Cuff Size: Normal)   Pulse (!) 106   Temp 98.6 F (37 C) (Oral)   Wt 145 lb 9.6 oz (66 kg)   SpO2 96%   BMI 24.99 kg/m    Physical Exam Constitutional:      General: She is not in acute distress.    Appearance: Normal appearance.  HENT:     Head: Normocephalic and atraumatic.     Nose: Nose normal.     Mouth/Throat:     Mouth: Mucous membranes are moist.  Cardiovascular:     Rate and Rhythm: Normal rate and regular rhythm.     Heart sounds: No murmur heard.    No gallop.  Pulmonary:     Effort: Pulmonary effort is normal. No respiratory distress.     Breath sounds: No wheezing, rhonchi or rales.  Musculoskeletal:     Right shoulder: Normal.     Left shoulder: Tenderness present. No swelling, deformity or bony tenderness. Normal range of motion. Normal strength.     Right upper arm: Normal.     Left upper arm: Normal.       Arms:     Comments: Left cervical trapezius muscle tight.  TTP of left  trapezius at cervical and lateral portions.  Skin:    General: Skin is warm and dry.  Neurological:     Mental Status: She is alert and oriented to person, place, and time.      No results found for any visits on 05/09/23.    Assessment & Plan:  Neck pain on left side -     Cyclobenzaprine HCl; Take 0.5 tablets (2.5 mg total) by mouth at bedtime as needed for muscle spasms.  Dispense: 10 tablet; Refill: 0  Strain of left trapezius muscle, initial encounter -     Cyclobenzaprine HCl; Take 0.5 tablets (2.5 mg total) by mouth at bedtime as needed for muscle spasms.  Dispense: 10 tablet; Refill: 0  Acute pain of left shoulder  Muscle spasm of left shoulder -     Cyclobenzaprine HCl; Take 0.5 tablets (2.5 mg total) by mouth at bedtime as needed for muscle spasms.  Dispense: 10 tablet; Refill: 0   Left-sided trapezius muscle strain from moving desk.  Discussed likely duration of symptoms.  Continue supportive care with Tylenol or NSAIDs, topical analgesics, heat, stretching, massage, etc.  Low-dose of muscle relaxer nightly as needed.  Given precautions.  Return if symptoms  worsen or fail to improve.   Deeann Saint, MD

## 2023-05-19 ENCOUNTER — Ambulatory Visit: Payer: 59 | Admitting: Family Medicine

## 2023-05-19 ENCOUNTER — Other Ambulatory Visit (HOSPITAL_COMMUNITY): Payer: Self-pay

## 2023-05-22 ENCOUNTER — Ambulatory Visit (INDEPENDENT_AMBULATORY_CARE_PROVIDER_SITE_OTHER): Payer: 59 | Admitting: Family Medicine

## 2023-05-22 ENCOUNTER — Other Ambulatory Visit (HOSPITAL_COMMUNITY): Payer: Self-pay

## 2023-05-22 ENCOUNTER — Encounter: Payer: Self-pay | Admitting: Family Medicine

## 2023-05-22 VITALS — BP 120/78 | HR 120 | Temp 98.1°F | Ht 64.0 in | Wt 142.4 lb

## 2023-05-22 DIAGNOSIS — F900 Attention-deficit hyperactivity disorder, predominantly inattentive type: Secondary | ICD-10-CM

## 2023-05-22 DIAGNOSIS — Z0001 Encounter for general adult medical examination with abnormal findings: Secondary | ICD-10-CM | POA: Diagnosis not present

## 2023-05-22 DIAGNOSIS — E0500 Thyrotoxicosis with diffuse goiter without thyrotoxic crisis or storm: Secondary | ICD-10-CM

## 2023-05-22 LAB — COMPREHENSIVE METABOLIC PANEL
ALT: 25 U/L (ref 0–35)
AST: 19 U/L (ref 0–37)
Albumin: 4.1 g/dL (ref 3.5–5.2)
Alkaline Phosphatase: 97 U/L (ref 39–117)
BUN: 16 mg/dL (ref 6–23)
CO2: 28 mEq/L (ref 19–32)
Calcium: 9.4 mg/dL (ref 8.4–10.5)
Chloride: 103 mEq/L (ref 96–112)
Creatinine, Ser: 0.57 mg/dL (ref 0.40–1.20)
GFR: 120.28 mL/min (ref >60.00)
Glucose, Bld: 157 mg/dL — ABNORMAL HIGH (ref 70–99)
Potassium: 4 mEq/L (ref 3.5–5.1)
Sodium: 138 mEq/L (ref 135–145)
Total Bilirubin: 0.8 mg/dL (ref 0.2–1.2)
Total Protein: 7.1 g/dL (ref 6.0–8.3)

## 2023-05-22 LAB — CBC
HCT: 41.8 % (ref 36.0–46.0)
Hemoglobin: 13.9 g/dL (ref 12.0–15.0)
MCHC: 33.2 g/dL (ref 30.0–36.0)
MCV: 87.5 fl (ref 78.0–100.0)
Platelets: 243 10*3/uL (ref 150.0–400.0)
RBC: 4.77 Mil/uL (ref 3.87–5.11)
RDW: 12.9 % (ref 11.5–15.5)
WBC: 6.9 10*3/uL (ref 4.0–10.5)

## 2023-05-22 LAB — T4, FREE: Free T4: 4.53 ng/dL — ABNORMAL HIGH (ref 0.60–1.60)

## 2023-05-22 LAB — TSH: TSH: 0 u[IU]/mL — ABNORMAL LOW (ref 0.35–5.50)

## 2023-05-22 LAB — T3, FREE: T3, Free: 9.9 pg/mL — ABNORMAL HIGH (ref 2.3–4.2)

## 2023-05-22 MED ORDER — AMPHETAMINE-DEXTROAMPHET ER 30 MG PO CP24
30.0000 mg | ORAL_CAPSULE | Freq: Every morning | ORAL | 0 refills | Status: DC
  Filled 2023-07-21 – 2023-08-09 (×2): qty 30, 30d supply, fill #0

## 2023-05-22 MED ORDER — AMPHETAMINE-DEXTROAMPHET ER 30 MG PO CP24
30.0000 mg | ORAL_CAPSULE | Freq: Every day | ORAL | 0 refills | Status: DC
  Filled 2023-05-22: qty 30, 30d supply, fill #0

## 2023-05-22 MED ORDER — ATENOLOL 25 MG PO TABS
25.0000 mg | ORAL_TABLET | Freq: Two times a day (BID) | ORAL | 3 refills | Status: DC
Start: 2023-05-22 — End: 2023-06-18
  Filled 2023-05-22: qty 60, 30d supply, fill #0
  Filled 2023-06-18: qty 60, 30d supply, fill #1

## 2023-05-22 MED ORDER — AMPHETAMINE-DEXTROAMPHET ER 30 MG PO CP24: 30.0000 mg | ORAL_CAPSULE | ORAL | 0 refills | Status: DC

## 2023-05-22 MED ORDER — METHIMAZOLE 10 MG PO TABS
10.0000 mg | ORAL_TABLET | Freq: Two times a day (BID) | ORAL | 0 refills | Status: DC
Start: 2023-05-22 — End: 2023-06-18
  Filled 2023-05-22: qty 60, 30d supply, fill #0
  Filled 2023-06-18: qty 60, 30d supply, fill #1

## 2023-05-22 NOTE — Assessment & Plan Note (Signed)
Ordering new labs, her anxiety is severe today -- she is currently off her medications (methimazole and atenolol) was not able to follow up with Dr. Elvera Lennox due to stress at work. I will go ahead and refill her medications today due to the amount of anxiety that she is reporting today. I reassured her that she will feel much better if she resumes her medication. I also recommended she see Dr. Elvera Lennox in follow up for continued management.

## 2023-05-22 NOTE — Assessment & Plan Note (Signed)
Checking CMP and CBC today, reports her attention and focus are controlled on the current regimen of 30 mg capsule in the morning and 10 mg in the afternoon, will continue this

## 2023-05-22 NOTE — Progress Notes (Signed)
Complete physical exam  Patient: Leah Perez   DOB: 1991/09/28   32 y.o. Female  MRN: [id number]  Subjective:    Chief Complaint  Patient presents with   Medical Management of Chronic Issues    Leah Perez is a 32 y.o. female who presents today for a complete physical exam. She reports consuming a general diet. Home exercise routine includes daily walking. She generally feels very anxious. She reports sleeping fairly well. She does not have additional problems to discuss today.   Anxiety-- pt Is reporting high levels of anxiety for the past few months. She reports she is having to take more xanax for panic attacks. States that she is having a lot of trouble sleeping lately.   I reviewed her chart and noticed no progress notes from Dr. Elvera Lennox, patient states that work has been so busy she hasn't been able to set up an appointment.    Most recent fall risk assessment:    05/09/2023    2:12 PM  Fall Risk   Falls in the past year? 0  Number falls in past yr: 0  Injury with Fall? 0  Risk for fall due to : No Fall Risks  Follow up Falls evaluation completed     Most recent depression screenings:    05/22/2023   10:00 AM 05/09/2023    2:12 PM  PHQ 2/9 Scores  PHQ - 2 Score 1 0  PHQ- 9 Score 2 3    Vision:Not within last year  and Dental: Current dental problems and No regular dental care   Patient Active Problem List   Diagnosis Date Noted   Graves disease 05/22/2023   Traumatic onycholysis 11/18/2022   Thyroid ophthalmopathy 12/14/2021   Hyperthyroidism 05/25/2021   ADHD (attention deficit hyperactivity disorder) 10/08/2012   History of migraine headaches 08/07/2011   Anxiety state 03/06/2009   DEPRESSION 03/06/2009      Patient Care Team: Karie Georges, MD as PCP - General (Family Medicine)   Outpatient Medications Prior to Visit  Medication Sig   ALPRAZolam (XANAX) 0.5 MG tablet Take 1 tablet (0.5 mg total) by mouth daily as needed for anxiety.    amphetamine-dextroamphetamine (ADDERALL) 10 MG tablet Take 1 tablet (10 mg total) by mouth daily as needed.   cyclobenzaprine (FLEXERIL) 5 MG tablet Take 0.5 tablets (2.5 mg total) by mouth at bedtime as needed for muscle spasms.   etonogestrel (NEXPLANON) 68 MG IMPL implant 1 each by Subdermal route once.   sertraline (ZOLOFT) 100 MG tablet Take 1 tablet by mouth daily. (Patient needs appointment)   sertraline (ZOLOFT) 25 MG tablet Take 1 tablet (25 mg) by mouth daily with 100mg  tablet. (Patient needs appointment)   tretinoin (RETIN-A) 0.025 % cream Apply topically at bedtime.   [DISCONTINUED] amphetamine-dextroamphetamine (ADDERALL XR) 30 MG 24 hr capsule Take 1 capsule (30 mg total) by mouth every morning.   [DISCONTINUED] amphetamine-dextroamphetamine (ADDERALL XR) 30 MG 24 hr capsule Take 1 capsule (30 mg total) by mouth every morning.   [DISCONTINUED] atenolol (TENORMIN) 25 MG tablet Take 1 tablet (25 mg total) by mouth 2 (two) times daily.   [DISCONTINUED] methimazole (TAPAZOLE) 10 MG tablet TAKE 1 TABLET BY MOUTH TWICE A DAY (Patient taking differently: Take 20 mg by mouth 2 (two) times daily.)   [DISCONTINUED] amphetamine-dextroamphetamine (ADDERALL XR) 30 MG 24 hr capsule Take 1 capsule (30 mg total) by mouth every morning.   No facility-administered medications prior to visit.    Review of  Systems  Constitutional:  Positive for diaphoresis.  HENT:  Negative for hearing loss and tinnitus.   Eyes:  Negative for blurred vision.  Respiratory:  Negative for shortness of breath.   Cardiovascular:  Positive for leg swelling. Negative for chest pain.  Gastrointestinal:  Negative for constipation, diarrhea and heartburn.  Genitourinary:  Negative for dysuria and urgency.  Musculoskeletal:  Negative for back pain and myalgias.  Neurological:  Negative for dizziness and headaches.  Psychiatric/Behavioral:  The patient is nervous/anxious and has insomnia.        Objective:     BP  120/78 (BP Location: Left Arm, Patient Position: Sitting, Cuff Size: Normal)   Pulse (!) 120   Temp 98.1 F (36.7 C) (Oral)   Ht 5\' 4"  (1.626 m)   Wt 142 lb 6.4 oz (64.6 kg)   SpO2 98%   BMI 24.44 kg/m    Physical Exam Vitals reviewed.  Constitutional:      Appearance: Normal appearance. She is well-groomed and normal weight.  HENT:     Right Ear: Tympanic membrane normal.     Left Ear: Tympanic membrane normal.     Mouth/Throat:     Mouth: Mucous membranes are moist.     Pharynx: No posterior oropharyngeal erythema.  Eyes:     Conjunctiva/sclera: Conjunctivae normal.     Comments: Exophthalmos bilaterally  Neck:     Thyroid: Thyromegaly present.  Cardiovascular:     Rate and Rhythm: Normal rate and regular rhythm.     Pulses: Normal pulses.     Heart sounds: S1 normal and S2 normal.  Pulmonary:     Effort: Pulmonary effort is normal.     Breath sounds: Normal breath sounds and air entry.  Abdominal:     General: Bowel sounds are normal.  Musculoskeletal:     Right lower leg: No edema.     Left lower leg: No edema.  Neurological:     Mental Status: She is alert and oriented to person, place, and time. Mental status is at baseline.     Gait: Gait is intact.  Psychiatric:        Mood and Affect: Mood and affect normal.        Speech: Speech normal.        Behavior: Behavior normal.        Judgment: Judgment normal.     No results found for any visits on 05/22/23.     Assessment & Plan:    Routine Health Maintenance and Physical Exam  Immunization History  Administered Date(s) Administered   Influenza Split 10/25/2011   Influenza,inj,Quad PF,6+ Mos 08/10/2015   Tdap 06/27/2015    Health Maintenance  Topic Date Due   COVID-19 Vaccine (1) 06/07/2023 (Originally 07/05/1991)   Hepatitis C Screening  11/08/2023 (Originally 01/04/2009)   INFLUENZA VACCINE  07/10/2023   PAP SMEAR-Modifier  05/25/2024   DTaP/Tdap/Td (2 - Td or Tdap) 06/26/2025   HIV Screening   Completed   HPV VACCINES  Aged Out    Discussed health benefits of physical activity, and encouraged her to engage in regular exercise appropriate for her age and condition.  Attention deficit hyperactivity disorder (ADHD), predominantly inattentive type Assessment & Plan: Checking CMP and CBC today, reports her attention and focus are controlled on the current regimen of 30 mg capsule in the morning and 10 mg in the afternoon, will continue this  Orders: -     Comprehensive metabolic panel -     CBC -  Amphetamine-Dextroamphet ER; Take 1 capsule (30 mg total) by mouth every morning.  Dispense: 30 capsule; Refill: 0 -     Amphetamine-Dextroamphet ER; Take 1 capsule (30 mg total) by mouth every morning.  Dispense: 30 capsule; Refill: 0 -     Amphetamine-Dextroamphet ER; Take 1 capsule (30 mg total) by mouth daily.  Dispense: 30 capsule; Refill: 0  Graves disease Assessment & Plan: Ordering new labs, her anxiety is severe today -- she is currently off her medications (methimazole and atenolol) was not able to follow up with Dr. Elvera Lennox due to stress at work. I will go ahead and refill her medications today due to the amount of anxiety that she is reporting today. I reassured her that she will feel much better if she resumes her medication. I also recommended she see Dr. Elvera Lennox in follow up for continued management.   Orders: -     TSH -     T4, free -     T3, free -     Atenolol; Take 1 tablet (25 mg total) by mouth 2 (two) times daily.  Dispense: 180 tablet; Refill: 3 -     methIMAzole; Take 1 tablet (10 mg total) by mouth 2 (two) times daily.  Dispense: 180 tablet; Refill: 0  Encounter for routine adult health examination with abnormal findings  Abnormal thyromegaly and exophthalmos on exam from untreated graves disease. I have placed orders for these tests also. Otherwise her physical exam was benign. Handout were given on healthy eating and exercise. I counseled patient about  anxiety management and sleep hygiene.   Return in about 3 months (around 08/22/2023) for video visit for ADHD refills.     Karie Georges, MD

## 2023-05-27 ENCOUNTER — Encounter: Payer: Self-pay | Admitting: Family Medicine

## 2023-05-27 ENCOUNTER — Ambulatory Visit (INDEPENDENT_AMBULATORY_CARE_PROVIDER_SITE_OTHER): Payer: 59 | Admitting: Family Medicine

## 2023-05-27 ENCOUNTER — Other Ambulatory Visit (HOSPITAL_COMMUNITY): Payer: Self-pay

## 2023-05-27 VITALS — BP 120/84 | HR 92 | Temp 98.3°F | Wt 141.4 lb

## 2023-05-27 DIAGNOSIS — S80861A Insect bite (nonvenomous), right lower leg, initial encounter: Secondary | ICD-10-CM

## 2023-05-27 DIAGNOSIS — W57XXXA Bitten or stung by nonvenomous insect and other nonvenomous arthropods, initial encounter: Secondary | ICD-10-CM | POA: Diagnosis not present

## 2023-05-27 MED ORDER — CLOBETASOL PROPIONATE 0.05 % EX OINT
1.0000 | TOPICAL_OINTMENT | Freq: Two times a day (BID) | CUTANEOUS | 0 refills | Status: DC
Start: 2023-05-27 — End: 2024-09-06
  Filled 2023-05-27 – 2023-06-18 (×2): qty 30, 15d supply, fill #0

## 2023-05-27 NOTE — Progress Notes (Signed)
   Established Patient Office Visit  Subjective   Patient ID: Leah Perez, female    DOB: June 22, 1991  Age: 32 y.o. MRN: [id number]  Chief Complaint  Patient presents with   Insect Bite    Right bite - Sunday    Pt was mowing her lawn and she had a suddne pain on the right lateral leg. She though she was bitten by something and she developed a reddish welt around the bite. The bite in the center is shallowly ulcerated, no signs of necrosis. Pt report no fever/chills, no pain oo the side of her leg. It is just very itchy. No pain, no purulent drainage, no red streaking.    Current Outpatient Medications  Medication Instructions   ALPRAZolam (XANAX) 0.5 mg, Oral, Daily PRN   [START ON 06/19/2023] amphetamine-dextroamphetamine (ADDERALL XR) 30 MG 24 hr capsule 30 mg, Oral, BH-each morning   [START ON 07/17/2023] amphetamine-dextroamphetamine (ADDERALL XR) 30 MG 24 hr capsule 30 mg, Oral, Every morning   amphetamine-dextroamphetamine (ADDERALL XR) 30 MG 24 hr capsule 30 mg, Oral, Daily   amphetamine-dextroamphetamine (ADDERALL) 10 MG tablet 10 mg, Oral, Daily PRN   atenolol (TENORMIN) 25 mg, Oral, 2 times daily   clobetasol ointment (TEMOVATE) 0.05 % Apply to affected area 2 (two) times daily.   cyclobenzaprine (FLEXERIL) 2.5 mg, Oral, At bedtime PRN   etonogestrel (NEXPLANON) 68 MG IMPL implant 1 each, Subdermal,  Once   methimazole (TAPAZOLE) 10 mg, Oral, 2 times daily   sertraline (ZOLOFT) 100 MG tablet Take 1 tablet by mouth daily. (Patient needs appointment)   sertraline (ZOLOFT) 25 MG tablet Take 1 tablet (25 mg) by mouth daily with 100mg  tablet. (Patient needs appointment)   tretinoin (RETIN-A) 0.025 % cream Topical, Daily at bedtime      Review of Systems  Constitutional:  Negative for chills and fever.  All other systems reviewed and are negative.     Objective:     BP 120/84 (BP Location: Left Arm, Patient Position: Sitting, Cuff Size: Normal)   Pulse 92   Temp  98.3 F (36.8 C) (Oral)   Wt 141 lb 6.4 oz (64.1 kg)   SpO2 98%   Breastfeeding No   BMI 24.27 kg/m    Physical Exam Vitals reviewed.  Constitutional:      Appearance: Normal appearance. She is normal weight.  Skin:    Findings: Lesion (there is about a 2 inch area on the right lower lateral leg that is raised, slightly erythematous, there is mild edema there are well, the center insect bite shows a shallow ulceration, no purulent drainage) present.  Neurological:     Mental Status: She is alert.      No results found for any visits on 05/27/23.    The ASCVD Risk score (Arnett DK, et al., 2019) failed to calculate for the following reasons:   The 2019 ASCVD risk score is only valid for ages 50 to 59    Assessment & Plan:  Insect bite of right lower extremity, initial encounter -     Clobetasol Propionate; Apply to affected area 2 (two) times daily.  Dispense: 30 g; Refill: 0   Most likely a localized allergic reaction to the insect bite. Abx are not needed at this time, will treat PRN with clobetasol for the intense itching  No follow-ups on file.    Karie Georges, MD

## 2023-05-29 ENCOUNTER — Other Ambulatory Visit (HOSPITAL_COMMUNITY): Payer: Self-pay

## 2023-05-29 ENCOUNTER — Other Ambulatory Visit: Payer: Self-pay | Admitting: Family Medicine

## 2023-05-29 DIAGNOSIS — F900 Attention-deficit hyperactivity disorder, predominantly inattentive type: Secondary | ICD-10-CM

## 2023-05-29 DIAGNOSIS — F411 Generalized anxiety disorder: Secondary | ICD-10-CM

## 2023-05-30 ENCOUNTER — Other Ambulatory Visit: Payer: Self-pay

## 2023-05-30 ENCOUNTER — Other Ambulatory Visit (HOSPITAL_COMMUNITY): Payer: Self-pay

## 2023-06-03 ENCOUNTER — Other Ambulatory Visit (HOSPITAL_COMMUNITY): Payer: Self-pay

## 2023-06-03 MED ORDER — AMPHETAMINE-DEXTROAMPHETAMINE 10 MG PO TABS
10.0000 mg | ORAL_TABLET | Freq: Every day | ORAL | 0 refills | Status: DC | PRN
Start: 2023-06-03 — End: 2023-06-18
  Filled 2023-06-03: qty 30, 30d supply, fill #0

## 2023-06-03 MED ORDER — ALPRAZOLAM 0.5 MG PO TABS
0.5000 mg | ORAL_TABLET | Freq: Every day | ORAL | 0 refills | Status: DC | PRN
Start: 2023-06-03 — End: 2023-06-18
  Filled 2023-06-03: qty 10, 10d supply, fill #0

## 2023-06-18 ENCOUNTER — Encounter: Payer: Self-pay | Admitting: Family Medicine

## 2023-06-18 ENCOUNTER — Other Ambulatory Visit: Payer: Self-pay | Admitting: Family Medicine

## 2023-06-18 ENCOUNTER — Other Ambulatory Visit: Payer: Self-pay

## 2023-06-18 ENCOUNTER — Other Ambulatory Visit (HOSPITAL_COMMUNITY): Payer: Self-pay

## 2023-06-18 DIAGNOSIS — E0500 Thyrotoxicosis with diffuse goiter without thyrotoxic crisis or storm: Secondary | ICD-10-CM

## 2023-06-18 DIAGNOSIS — F411 Generalized anxiety disorder: Secondary | ICD-10-CM

## 2023-06-18 DIAGNOSIS — F900 Attention-deficit hyperactivity disorder, predominantly inattentive type: Secondary | ICD-10-CM

## 2023-06-18 MED ORDER — METHIMAZOLE 10 MG PO TABS
10.0000 mg | ORAL_TABLET | Freq: Two times a day (BID) | ORAL | 0 refills | Status: DC
Start: 2023-06-18 — End: 2023-12-09
  Filled 2023-06-26: qty 60, 30d supply, fill #0
  Filled 2023-07-21 – 2023-08-09 (×2): qty 60, 30d supply, fill #1
  Filled 2023-09-07: qty 60, 30d supply, fill #2

## 2023-06-18 MED ORDER — ATENOLOL 25 MG PO TABS
25.0000 mg | ORAL_TABLET | Freq: Two times a day (BID) | ORAL | 0 refills | Status: DC
Start: 2023-06-18 — End: 2023-08-12
  Filled 2023-06-26: qty 60, 30d supply, fill #0
  Filled 2023-07-21 – 2023-08-09 (×2): qty 60, 30d supply, fill #1

## 2023-06-18 MED ORDER — SERTRALINE HCL 25 MG PO TABS
25.0000 mg | ORAL_TABLET | Freq: Every day | ORAL | 0 refills | Status: DC
Start: 2023-06-18 — End: 2023-08-12
  Filled 2023-06-26: qty 30, 30d supply, fill #0
  Filled 2023-07-21 – 2023-08-09 (×2): qty 30, 30d supply, fill #1

## 2023-06-18 NOTE — Telephone Encounter (Signed)
Ok to send her noncontrolled scripts as 90 days supplies and to schedule a video visit with her at her convenience.

## 2023-06-19 ENCOUNTER — Other Ambulatory Visit (HOSPITAL_COMMUNITY): Payer: Self-pay

## 2023-06-20 ENCOUNTER — Other Ambulatory Visit (HOSPITAL_COMMUNITY): Payer: Self-pay

## 2023-06-23 ENCOUNTER — Other Ambulatory Visit (HOSPITAL_COMMUNITY): Payer: Self-pay

## 2023-06-23 MED ORDER — AMPHETAMINE-DEXTROAMPHET ER 30 MG PO CP24
30.0000 mg | ORAL_CAPSULE | Freq: Every day | ORAL | 0 refills | Status: DC
Start: 2023-06-23 — End: 2023-08-12
  Filled 2023-06-23: qty 30, 30d supply, fill #0

## 2023-06-23 MED ORDER — AMPHETAMINE-DEXTROAMPHETAMINE 10 MG PO TABS
10.0000 mg | ORAL_TABLET | Freq: Every day | ORAL | 0 refills | Status: DC | PRN
Start: 2023-06-23 — End: 2023-08-12
  Filled 2023-06-23 – 2023-08-09 (×3): qty 30, 30d supply, fill #0

## 2023-06-24 ENCOUNTER — Other Ambulatory Visit (HOSPITAL_COMMUNITY): Payer: Self-pay

## 2023-06-24 MED ORDER — ALPRAZOLAM 0.5 MG PO TABS
0.5000 mg | ORAL_TABLET | Freq: Every day | ORAL | 0 refills | Status: DC | PRN
Start: 2023-06-24 — End: 2023-06-27
  Filled 2023-06-24: qty 10, 10d supply, fill #0

## 2023-06-26 ENCOUNTER — Other Ambulatory Visit: Payer: Self-pay | Admitting: Family Medicine

## 2023-06-26 ENCOUNTER — Other Ambulatory Visit (HOSPITAL_COMMUNITY): Payer: Self-pay

## 2023-06-26 DIAGNOSIS — E0500 Thyrotoxicosis with diffuse goiter without thyrotoxic crisis or storm: Secondary | ICD-10-CM

## 2023-06-26 DIAGNOSIS — F411 Generalized anxiety disorder: Secondary | ICD-10-CM

## 2023-06-26 NOTE — Telephone Encounter (Signed)
Spoke with the patient as it appears all 3 Rxs were sent on 7/10.  Message sent to PCP as patient stated the pharmacy does not have these and she asked if PCP could give 30 tablets of Alprazolam instead of 10 tablets?

## 2023-06-26 NOTE — Telephone Encounter (Signed)
Pt states her insurance terminates today and that she previously asked for 90 day supplies ALPRAZolam (XANAX) 0.5 MG tablet atenolol (TENORMIN) 25 MG tablet  sertraline (ZOLOFT) 25 MG tablet methimazole (TAPAZOLE) 10 MG tablet     [place] [facility] Phone: [phone]  Fax: [phone]

## 2023-06-26 NOTE — Addendum Note (Signed)
Addended by: Johnella Moloney on: 06/26/2023 01:16 PM   Modules accepted: Orders

## 2023-06-27 ENCOUNTER — Other Ambulatory Visit (HOSPITAL_COMMUNITY): Payer: Self-pay

## 2023-06-27 MED ORDER — ALPRAZOLAM 0.5 MG PO TABS
0.5000 mg | ORAL_TABLET | Freq: Every day | ORAL | 0 refills | Status: DC | PRN
Start: 2023-06-27 — End: 2023-08-12
  Filled 2023-06-27: qty 30, 30d supply, fill #0
  Filled 2023-07-21 – 2023-08-09 (×2): qty 10, 30d supply, fill #0

## 2023-06-27 NOTE — Addendum Note (Signed)
Addended by: Karie Georges on: 06/27/2023 09:52 AM   Modules accepted: Orders

## 2023-06-27 NOTE — Telephone Encounter (Signed)
Noted  

## 2023-06-27 NOTE — Telephone Encounter (Signed)
Pt already picked up the 30 days supplies yesterday--- spoke wit hthe pharmacist to confirm this.

## 2023-07-07 ENCOUNTER — Other Ambulatory Visit (HOSPITAL_COMMUNITY): Payer: Self-pay

## 2023-07-14 ENCOUNTER — Other Ambulatory Visit (HOSPITAL_COMMUNITY): Payer: Self-pay

## 2023-07-21 ENCOUNTER — Other Ambulatory Visit: Payer: Self-pay

## 2023-07-21 ENCOUNTER — Other Ambulatory Visit: Payer: Self-pay | Admitting: Family Medicine

## 2023-07-21 ENCOUNTER — Other Ambulatory Visit (HOSPITAL_COMMUNITY): Payer: Self-pay

## 2023-07-21 DIAGNOSIS — F900 Attention-deficit hyperactivity disorder, predominantly inattentive type: Secondary | ICD-10-CM

## 2023-08-06 ENCOUNTER — Other Ambulatory Visit (HOSPITAL_COMMUNITY): Payer: Self-pay

## 2023-08-09 ENCOUNTER — Other Ambulatory Visit (HOSPITAL_COMMUNITY): Payer: Self-pay

## 2023-08-12 ENCOUNTER — Telehealth: Payer: Medicaid Other | Admitting: Family Medicine

## 2023-08-12 ENCOUNTER — Other Ambulatory Visit (HOSPITAL_COMMUNITY): Payer: Self-pay

## 2023-08-12 ENCOUNTER — Encounter: Payer: Self-pay | Admitting: Family Medicine

## 2023-08-12 VITALS — BP 113/69 | HR 91 | Temp 97.0°F

## 2023-08-12 DIAGNOSIS — F900 Attention-deficit hyperactivity disorder, predominantly inattentive type: Secondary | ICD-10-CM

## 2023-08-12 DIAGNOSIS — E0500 Thyrotoxicosis with diffuse goiter without thyrotoxic crisis or storm: Secondary | ICD-10-CM | POA: Diagnosis not present

## 2023-08-12 DIAGNOSIS — F411 Generalized anxiety disorder: Secondary | ICD-10-CM

## 2023-08-12 MED ORDER — AMPHETAMINE-DEXTROAMPHET ER 30 MG PO CP24
30.0000 mg | ORAL_CAPSULE | ORAL | 0 refills | Status: DC
Start: 2023-08-12 — End: 2023-10-27
  Filled 2023-08-12 – 2023-09-07 (×2): qty 30, 30d supply, fill #0

## 2023-08-12 MED ORDER — AMPHETAMINE-DEXTROAMPHET ER 30 MG PO CP24
30.0000 mg | ORAL_CAPSULE | Freq: Every morning | ORAL | 0 refills | Status: DC
Start: 2023-09-10 — End: 2023-12-26

## 2023-08-12 MED ORDER — AMPHETAMINE-DEXTROAMPHETAMINE 10 MG PO TABS
10.0000 mg | ORAL_TABLET | Freq: Every day | ORAL | 0 refills | Status: DC | PRN
Start: 2023-08-12 — End: 2023-10-27
  Filled 2023-08-12 – 2023-09-07 (×2): qty 30, 30d supply, fill #0

## 2023-08-12 MED ORDER — SERTRALINE HCL 100 MG PO TABS
100.0000 mg | ORAL_TABLET | Freq: Every day | ORAL | 3 refills | Status: DC
  Filled 2023-08-12 – 2023-09-07 (×2): qty 90, 90d supply, fill #0
  Filled 2023-12-08 – 2023-12-09 (×2): qty 90, 90d supply, fill #1
  Filled 2023-12-09: qty 90, 90d supply, fill #0

## 2023-08-12 MED ORDER — AMPHETAMINE-DEXTROAMPHET ER 30 MG PO CP24
30.0000 mg | ORAL_CAPSULE | Freq: Every day | ORAL | 0 refills | Status: DC
Start: 2023-10-10 — End: 2023-12-26
  Filled 2023-12-08 – 2023-12-09 (×2): qty 30, 30d supply, fill #0

## 2023-08-12 MED ORDER — ATENOLOL 25 MG PO TABS
25.0000 mg | ORAL_TABLET | Freq: Two times a day (BID) | ORAL | 0 refills | Status: DC
Start: 2023-08-12 — End: 2023-12-09
  Filled 2023-08-12 – 2023-09-07 (×2): qty 180, 90d supply, fill #0

## 2023-08-12 MED ORDER — ALPRAZOLAM 0.5 MG PO TABS
0.5000 mg | ORAL_TABLET | Freq: Every day | ORAL | 0 refills | Status: AC | PRN
  Filled 2023-08-12 – 2023-09-07 (×2): qty 15, 15d supply, fill #0

## 2023-08-12 MED ORDER — SERTRALINE HCL 25 MG PO TABS
25.0000 mg | ORAL_TABLET | Freq: Every day | ORAL | 0 refills | Status: DC
Start: 2023-08-12 — End: 2023-12-09
  Filled 2023-08-12 – 2023-09-07 (×2): qty 90, 90d supply, fill #0

## 2023-08-12 NOTE — Assessment & Plan Note (Signed)
Chronic, stable, reports her attention and focus are controlled on the current regimen of 30 mg capsule in the morning and 10 mg in the afternoon, will continue this regimen as prescribed.

## 2023-08-12 NOTE — Assessment & Plan Note (Signed)
Pt reports compliance with her medications, ordering her Thyroid testing. Continue methimazole 10 mg BID and atenolol 25 mg BID

## 2023-08-12 NOTE — Progress Notes (Signed)
Virtual Medical Office Visit  Patient:  Leah Perez      Age: 32 y.o.       Sex:  female  Date:   08/12/2023  PCP:    Karie Georges, MD   Today's Healthcare Provider: Karie Georges, MD    Assessment/Plan:   Summary assessment:  [name] was seen today for medical management of chronic issues.  Attention deficit hyperactivity disorder (ADHD), predominantly inattentive type Assessment & Plan: Chronic, stable, reports her attention and focus are controlled on the current regimen of 30 mg capsule in the morning and 10 mg in the afternoon, will continue this regimen as prescribed.   Orders: -     Amphetamine-Dextroamphet ER; Take 1 capsule (30 mg total) by mouth every morning.  Dispense: 30 capsule; Refill: 0 -     Amphetamine-Dextroamphet ER; Take 1 capsule (30 mg total) by mouth every morning.  Dispense: 30 capsule; Refill: 0 -     Amphetamine-Dextroamphetamine; Take 1 tablet (10 mg) by mouth daily as needed.  Dispense: 30 tablet; Refill: 0 -     Amphetamine-Dextroamphet ER; Take 1 capsule (30 mg total) by mouth daily.  Dispense: 30 capsule; Refill: 0  Anxiety state Overview: Qualifier: Diagnosis of  By: Amador Cunas  MD, Janett Labella   Assessment & Plan: Anxiety is better controlled, on sertraline 125 mg daily, atenolol 25 mg BID and xanax 0.5 mg daily PRN, we discussed again the risks of long term use of benzos. Pt is currently still having high levels of anxiety due to losing her job, still looking for employment. Will continue her current regimen for now.   Orders: -     Sertraline HCl; Take 1 tablet (25 mg) by mouth daily (with 100mg  tablet). (Patient needs appointment)  Dispense: 90 tablet; Refill: 0 -     Sertraline HCl; Take 1 tablet by mouth daily. (Patient needs appointment)  Dispense: 90 tablet; Refill: 3 -     ALPRAZolam; Take 1 tablet (0.5 mg total) by mouth daily as needed for anxiety.  Dispense: 15 tablet; Refill: 0  Graves disease Assessment & Plan: Pt  reports compliance with her medications, ordering her Thyroid testing. Continue methimazole 10 mg BID and atenolol 25 mg BID  Orders: -     TSH; Future -     T4, free; Future -     T3, free; Future -     Atenolol; Take 1 tablet (25 mg) by mouth 2 times daily.  Dispense: 180 tablet; Refill: 0     Return in about 3 months (around 11/11/2023) for med refills, ok to be a video visit.   She was advised to call the office or go to ER if her condition worsens    Subjective:   Leah Perez is a 32 y.o. female with PMH significant for: Past Medical History:  Diagnosis Date   ANXIETY 03/06/2009   DEPRESSION 03/06/2009   Depression    Migraine    NSVD (normal spontaneous vaginal delivery) 09/12/2015   PROM (premature rupture of membranes) 09/09/2015   Rh negative status during pregnancy in second trimester, antepartum 05/31/2015   [ ] Rhophylac    Thyroid disease      Presenting today with: Chief Complaint  Patient presents with   Medical Management of Chronic Issues     She clarifies and reports that her condition: ADHD-- pt reports that her medication is working well. She was recently terminated at her work and has yet to find a  new job. States she is very stressed out over this and is worried about her finances. The medication is working well, she is able to finish her tasks and it is not interfering with sleep. Anxiety-- pt continues to take the xanax 0.5 mg daily as needed, is using about 10 tablets per month of this medication. Also on the atenolol 25 mg twice a day for her hyperthyroid symptoms. She reports that she is being more consistent with taking her medications and it is definitely helping control her symptoms. She reports that before she was not taking her methimazole and atenolol consistently and her anxiety levels were much higher. States since losing her job she has remained anxious, but doesn't feel that it is overwhelming her anymore.  Hyperthyroid- pt is taking  her medications regularly, reports improvement in her BP and high heart rate. I watched her perform her vital signs in the video visit today and I recorded them in the computer.   She denies having any: Side effects to the medication.           Objective/Observations  Physical Exam:  Polite and friendly Gen: NAD, resting comfortably Pulm: Normal work of breathing Neuro: Grossly normal, moves all extremities Psych: Normal affect and thought content Problem specific physical exam findings:  exophthalmos  No images are attached to the encounter or orders placed in the encounter.    Results: No results found for any visits on 08/12/23.   Recent Results (from the past 2160 hour(s))  CMP     Status: Abnormal   Collection Time: 05/22/23 10:23 AM  Result Value Ref Range   Sodium 138 135 - 145 mEq/L   Potassium 4.0 3.5 - 5.1 mEq/L   Chloride 103 96 - 112 mEq/L   CO2 28 19 - 32 mEq/L   Glucose, Bld 157 (H) 70 - 99 mg/dL   BUN 16 6 - 23 mg/dL   Creatinine, Ser 1.61 0.40 - 1.20 mg/dL   Total Bilirubin 0.8 0.2 - 1.2 mg/dL   Alkaline Phosphatase 97 39 - 117 U/L   AST 19 0 - 37 U/L   ALT 25 0 - 35 U/L   Total Protein 7.1 6.0 - 8.3 g/dL   Albumin 4.1 3.5 - 5.2 g/dL   GFR 096.04 >54.09 mL/min    Comment: Calculated using the CKD-EPI Creatinine Equation (2021)   Calcium 9.4 8.4 - 10.5 mg/dL  CBC (no diff)     Status: None   Collection Time: 05/22/23 10:23 AM  Result Value Ref Range   WBC 6.9 4.0 - 10.5 K/uL   RBC 4.77 3.87 - 5.11 Mil/uL   Platelets 243.0 150.0 - 400.0 K/uL   Hemoglobin 13.9 12.0 - 15.0 g/dL   HCT 81.1 91.4 - 78.2 %   MCV 87.5 78.0 - 100.0 fl   MCHC 33.2 30.0 - 36.0 g/dL   RDW 95.6 21.3 - 08.6 %  TSH     Status: Abnormal   Collection Time: 05/22/23 10:23 AM  Result Value Ref Range   TSH 0.00 (L) 0.35 - 5.50 uIU/mL  T4, Free     Status: Abnormal   Collection Time: 05/22/23 10:23 AM  Result Value Ref Range   Free T4 4.53 (H) 0.60 - 1.60 ng/dL    Comment:  Specimens from patients who are undergoing biotin therapy and /or ingesting biotin supplements may contain high levels of biotin.  The higher biotin concentration in these specimens interferes with this Free T4 assay.  Specimens that contain  high levels  of biotin may cause false high results for this Free T4 assay.  Please interpret results in light of the total clinical presentation of the patient.    T3, Free     Status: Abnormal   Collection Time: 05/22/23 10:23 AM  Result Value Ref Range   T3, Free 9.9 (H) 2.3 - 4.2 pg/mL           Virtual Visit via Video   I connected with Belmira Harne Bergh on 08/12/23 at  9:30 AM EDT by a video enabled telemedicine application and verified that I am speaking with the correct person using two identifiers. The limitations of evaluation and management by telemedicine and the availability of in person appointments were discussed. The patient expressed understanding and agreed to proceed.   Percentage of appointment time on video:  100% Patient location: Home Provider location: [facility] [facility] Persons participating in the virtual visit: Myself and Patient

## 2023-08-12 NOTE — Assessment & Plan Note (Signed)
Anxiety is better controlled, on sertraline 125 mg daily, atenolol 25 mg BID and xanax 0.5 mg daily PRN, we discussed again the risks of long term use of benzos. Pt is currently still having high levels of anxiety due to losing her job, still looking for employment. Will continue her current regimen for now.

## 2023-08-13 ENCOUNTER — Other Ambulatory Visit (HOSPITAL_COMMUNITY): Payer: Self-pay

## 2023-08-14 ENCOUNTER — Other Ambulatory Visit: Payer: Self-pay | Admitting: Oncology

## 2023-08-14 DIAGNOSIS — Z006 Encounter for examination for normal comparison and control in clinical research program: Secondary | ICD-10-CM

## 2023-08-15 ENCOUNTER — Other Ambulatory Visit: Payer: Medicaid Other

## 2023-09-08 ENCOUNTER — Other Ambulatory Visit (HOSPITAL_COMMUNITY): Payer: Self-pay

## 2023-09-08 ENCOUNTER — Other Ambulatory Visit: Payer: Self-pay

## 2023-10-11 ENCOUNTER — Other Ambulatory Visit (HOSPITAL_COMMUNITY): Payer: Self-pay

## 2023-10-11 ENCOUNTER — Telehealth: Payer: Medicaid Other | Admitting: Family

## 2023-10-11 DIAGNOSIS — R399 Unspecified symptoms and signs involving the genitourinary system: Secondary | ICD-10-CM

## 2023-10-11 MED ORDER — CEPHALEXIN 500 MG PO CAPS
500.0000 mg | ORAL_CAPSULE | Freq: Two times a day (BID) | ORAL | 0 refills | Status: DC
Start: 2023-10-11 — End: 2023-12-26
  Filled 2023-10-11: qty 14, 7d supply, fill #0

## 2023-10-11 NOTE — Progress Notes (Signed)
Virtual Visit Consent   [name], you are scheduled for a virtual visit with a [facility] provider today. Just as with appointments in the office, your consent must be obtained to participate. Your consent will be active for this visit and any virtual visit you may have with one of our providers in the next 365 days. If you have a MyChart account, a copy of this consent can be sent to you electronically.  As this is a virtual visit, video technology does not allow for your provider to perform a traditional examination. This may limit your provider's ability to fully assess your condition. If your provider identifies any concerns that need to be evaluated in person or the need to arrange testing (such as labs, EKG, etc.), we will make arrangements to do so. Although advances in technology are sophisticated, we cannot ensure that it will always work on either your end or our end. If the connection with a video visit is poor, the visit may have to be switched to a telephone visit. With either a video or telephone visit, we are not always able to ensure that we have a secure connection.  By engaging in this virtual visit, you consent to the provision of healthcare and authorize for your insurance to be billed (if applicable) for the services provided during this visit. Depending on your insurance coverage, you may receive a charge related to this service.  I need to obtain your verbal consent now. Are you willing to proceed with your visit today? Leah Perez has provided verbal consent on 10/11/2023 for a virtual visit (video or telephone). Jannifer Rodney, FNP  Date: 10/11/2023 11:49 AM  Virtual Visit via Video Note   I, Jannifer Rodney, connected with  Leah Perez  ([id number], [birth year]) on 10/11/23 at 12:00 PM EDT by a video-enabled telemedicine application and verified that I am speaking with the correct person using two identifiers.  Location: Patient: Virtual Visit Location  Patient: Home Provider: Virtual Visit Location Provider: Home Office   I discussed the limitations of evaluation and management by telemedicine and the availability of in person appointments. The patient expressed understanding and agreed to proceed.    History of Present Illness: Leah Perez is a 32 y.o. who identifies as a female who was assigned female at birth, and is being seen today for UTI symptoms that started 3 days ago and worsening.  HPI: Dysuria  This is a new problem. The current episode started in the past 7 days. The problem occurs every urination. The problem has been unchanged. The pain is mild. Associated symptoms include frequency, hesitancy and urgency. Pertinent negatives include no hematuria, nausea or vomiting. She has tried increased fluids for the symptoms. The treatment provided mild relief.    Problems:  Patient Active Problem List   Diagnosis Date Noted   Graves disease 05/22/2023   Traumatic onycholysis 11/18/2022   Thyroid ophthalmopathy 12/14/2021   Hyperthyroidism 05/25/2021   ADHD (attention deficit hyperactivity disorder) 10/08/2012   History of migraine headaches 08/07/2011   Anxiety state 03/06/2009   DEPRESSION 03/06/2009    Allergies:  Allergies  Allergen Reactions   Doxycycline Hives and Rash   Medications:  Current Outpatient Medications:    amphetamine-dextroamphetamine (ADDERALL XR) 30 MG 24 hr capsule, Take 1 capsule (30 mg total) by mouth every morning., Disp: 30 capsule, Rfl: 0   amphetamine-dextroamphetamine (ADDERALL XR) 30 MG 24 hr capsule, Take 1 capsule (30 mg total) by mouth every morning., Disp:  30 capsule, Rfl: 0   amphetamine-dextroamphetamine (ADDERALL XR) 30 MG 24 hr capsule, Take 1 capsule (30 mg total) by mouth daily., Disp: 30 capsule, Rfl: 0   amphetamine-dextroamphetamine (ADDERALL) 10 MG tablet, Take 1 tablet (10 mg) by mouth daily as needed., Disp: 30 tablet, Rfl: 0   atenolol (TENORMIN) 25 MG tablet, Take 1 tablet  (25 mg) by mouth 2 times daily., Disp: 180 tablet, Rfl: 0   clobetasol ointment (TEMOVATE) 0.05 %, Apply to affected area 2 (two) times daily., Disp: 30 g, Rfl: 0   cyclobenzaprine (FLEXERIL) 5 MG tablet, Take 0.5 tablets (2.5 mg total) by mouth at bedtime as needed for muscle spasms., Disp: 10 tablet, Rfl: 0   etonogestrel (NEXPLANON) 68 MG IMPL implant, 1 each by Subdermal route once., Disp: , Rfl:    methimazole (TAPAZOLE) 10 MG tablet, Take 1 tablet (10 mg) by mouth 2 times daily., Disp: 180 tablet, Rfl: 0   sertraline (ZOLOFT) 100 MG tablet, Take 1 tablet by mouth daily. (Patient needs appointment), Disp: 90 tablet, Rfl: 3   sertraline (ZOLOFT) 25 MG tablet, Take 1 tablet (25 mg) by mouth daily (with 100mg  tablet). (Patient needs appointment), Disp: 90 tablet, Rfl: 0   tretinoin (RETIN-A) 0.025 % cream, Apply topically at bedtime., Disp: 45 g, Rfl: 2  Observations/Objective: Patient is well-developed, well-nourished in no acute distress.  Resting comfortably  at home.  Head is normocephalic, atraumatic.  No labored breathing.  Speech is clear and coherent with logical content.  Patient is alert and oriented at baseline.    Assessment and Plan: 1. UTI symptoms  Force fluids AZO over the counter X2 days Follow up if symptoms worsen or do not improve    Follow Up Instructions: I discussed the assessment and treatment plan with the patient. The patient was provided an opportunity to ask questions and all were answered. The patient agreed with the plan and demonstrated an understanding of the instructions.  A copy of instructions were sent to the patient via MyChart unless otherwise noted below.     The patient was advised to call back or seek an in-person evaluation if the symptoms worsen or if the condition fails to improve as anticipated.    Jannifer Rodney, FNP

## 2023-10-27 ENCOUNTER — Other Ambulatory Visit: Payer: Self-pay | Admitting: Family Medicine

## 2023-10-27 DIAGNOSIS — F900 Attention-deficit hyperactivity disorder, predominantly inattentive type: Secondary | ICD-10-CM

## 2023-10-28 ENCOUNTER — Other Ambulatory Visit (HOSPITAL_COMMUNITY): Payer: Self-pay

## 2023-10-28 ENCOUNTER — Other Ambulatory Visit: Payer: Self-pay

## 2023-10-28 ENCOUNTER — Other Ambulatory Visit (HOSPITAL_BASED_OUTPATIENT_CLINIC_OR_DEPARTMENT_OTHER): Payer: Self-pay

## 2023-10-28 MED ORDER — AMPHETAMINE-DEXTROAMPHETAMINE 10 MG PO TABS
10.0000 mg | ORAL_TABLET | Freq: Every day | ORAL | 0 refills | Status: DC | PRN
  Filled 2023-10-28: qty 30, 30d supply, fill #0

## 2023-10-28 MED ORDER — AMPHETAMINE-DEXTROAMPHET ER 30 MG PO CP24
30.0000 mg | ORAL_CAPSULE | ORAL | 0 refills | Status: DC
Start: 2023-10-28 — End: 2023-12-23
  Filled 2023-10-28: qty 30, 30d supply, fill #0

## 2023-12-08 ENCOUNTER — Other Ambulatory Visit: Payer: Self-pay | Admitting: Family Medicine

## 2023-12-08 ENCOUNTER — Other Ambulatory Visit: Payer: Self-pay

## 2023-12-08 ENCOUNTER — Other Ambulatory Visit (HOSPITAL_COMMUNITY): Payer: Self-pay

## 2023-12-08 DIAGNOSIS — E0500 Thyrotoxicosis with diffuse goiter without thyrotoxic crisis or storm: Secondary | ICD-10-CM

## 2023-12-08 DIAGNOSIS — F411 Generalized anxiety disorder: Secondary | ICD-10-CM

## 2023-12-09 ENCOUNTER — Other Ambulatory Visit (HOSPITAL_BASED_OUTPATIENT_CLINIC_OR_DEPARTMENT_OTHER): Payer: Self-pay

## 2023-12-09 ENCOUNTER — Other Ambulatory Visit: Payer: Self-pay

## 2023-12-09 ENCOUNTER — Other Ambulatory Visit: Payer: Self-pay | Admitting: Family Medicine

## 2023-12-09 DIAGNOSIS — E0500 Thyrotoxicosis with diffuse goiter without thyrotoxic crisis or storm: Secondary | ICD-10-CM

## 2023-12-09 DIAGNOSIS — F411 Generalized anxiety disorder: Secondary | ICD-10-CM

## 2023-12-12 ENCOUNTER — Other Ambulatory Visit (HOSPITAL_COMMUNITY): Payer: Self-pay

## 2023-12-12 ENCOUNTER — Other Ambulatory Visit: Payer: Self-pay

## 2023-12-12 MED ORDER — ATENOLOL 25 MG PO TABS
25.0000 mg | ORAL_TABLET | Freq: Two times a day (BID) | ORAL | 0 refills | Status: DC
  Filled 2023-12-12 (×2): qty 180, 90d supply, fill #0

## 2023-12-12 MED ORDER — METHIMAZOLE 10 MG PO TABS
10.0000 mg | ORAL_TABLET | Freq: Two times a day (BID) | ORAL | 0 refills | Status: DC
  Filled 2023-12-12: qty 180, 90d supply, fill #0

## 2023-12-12 MED ORDER — SERTRALINE HCL 25 MG PO TABS
25.0000 mg | ORAL_TABLET | Freq: Every day | ORAL | 0 refills | Status: DC
  Filled 2023-12-12: qty 90, 90d supply, fill #0

## 2023-12-12 NOTE — Telephone Encounter (Signed)
 I will fill these but please ask patient to schedule a follow up visit. She will be due in the next month for a visit.

## 2023-12-22 ENCOUNTER — Other Ambulatory Visit: Payer: Self-pay | Admitting: Family Medicine

## 2023-12-22 ENCOUNTER — Other Ambulatory Visit (HOSPITAL_BASED_OUTPATIENT_CLINIC_OR_DEPARTMENT_OTHER): Payer: Self-pay

## 2023-12-22 ENCOUNTER — Other Ambulatory Visit (HOSPITAL_COMMUNITY): Payer: Self-pay

## 2023-12-22 ENCOUNTER — Encounter: Payer: Self-pay | Admitting: Family Medicine

## 2023-12-22 ENCOUNTER — Telehealth: Payer: Self-pay

## 2023-12-22 DIAGNOSIS — F900 Attention-deficit hyperactivity disorder, predominantly inattentive type: Secondary | ICD-10-CM

## 2023-12-22 NOTE — Telephone Encounter (Signed)
 Copied from CRM [id number]. Topic: Clinical - Medication Question >> Dec 22, 2023  3:20 PM Pinkey ORN wrote: Reason for CRM: amphetamine -dextroamphetamine  (ADDERALL ) 10 MG tablet >> Dec 22, 2023  3:21 PM Pinkey ORN wrote: Patient states she has put in a request for her medication numerous different times. Patient states she's needing approval from Ozell Railing MD and is requesting a call back to briefly speak with her in regards to the subject of her medication.

## 2023-12-22 NOTE — Telephone Encounter (Deleted)
 Copied from CRM [id number]. Topic: Clinical - Medication Refill >> Dec 22, 2023  3:14 PM Pinkey ORN wrote: Most Recent Primary Care Visit:  Provider: OZELL HERON HERO  Department: [facility]  Visit Type: [facility] VIDEO VISIT  Date: 08/12/2023  Medication: ***  Has the patient contacted their pharmacy?  (Agent: If no, request that the patient contact the pharmacy for the refill. If patient does not wish to contact the pharmacy document the reason why and proceed with request.) (Agent: If yes, when and what did the pharmacy advise?)  Is this the correct pharmacy for this prescription?  If no, delete pharmacy and type the correct one.  This is the patient's preferred pharmacy:  [facility] [address] [place] [address] Phone: [phone] Fax: [phone]   Has the prescription been filled recently?   Is the patient out of the medication?   Has the patient been seen for an appointment in the last year OR does the patient have an upcoming appointment?   Can we respond through MyChart?   Agent: Please be advised that Rx refills may take up to 3 business days. We ask that you follow-up with your pharmacy.

## 2023-12-23 ENCOUNTER — Other Ambulatory Visit (HOSPITAL_COMMUNITY): Payer: Self-pay

## 2023-12-23 ENCOUNTER — Other Ambulatory Visit: Payer: Self-pay | Admitting: Family Medicine

## 2023-12-23 ENCOUNTER — Other Ambulatory Visit (INDEPENDENT_AMBULATORY_CARE_PROVIDER_SITE_OTHER): Payer: Medicaid Other

## 2023-12-23 ENCOUNTER — Encounter: Payer: Self-pay | Admitting: Family Medicine

## 2023-12-23 DIAGNOSIS — E0500 Thyrotoxicosis with diffuse goiter without thyrotoxic crisis or storm: Secondary | ICD-10-CM

## 2023-12-23 DIAGNOSIS — F900 Attention-deficit hyperactivity disorder, predominantly inattentive type: Secondary | ICD-10-CM

## 2023-12-23 LAB — T4, FREE: Free T4: 2.78 ng/dL — ABNORMAL HIGH (ref 0.60–1.60)

## 2023-12-23 LAB — T3, FREE: T3, Free: 9 pg/mL — ABNORMAL HIGH (ref 2.3–4.2)

## 2023-12-23 LAB — TSH: TSH: 0 u[IU]/mL — ABNORMAL LOW (ref 0.35–5.50)

## 2023-12-23 MED ORDER — AMPHETAMINE-DEXTROAMPHET ER 30 MG PO CP24
30.0000 mg | ORAL_CAPSULE | ORAL | 0 refills | Status: DC
  Filled 2023-12-23: qty 30, 30d supply, fill #0

## 2023-12-23 MED ORDER — AMPHETAMINE-DEXTROAMPHETAMINE 10 MG PO TABS
10.0000 mg | ORAL_TABLET | Freq: Every day | ORAL | 0 refills | Status: DC | PRN
  Filled 2023-12-23: qty 30, 30d supply, fill #0

## 2023-12-23 NOTE — Telephone Encounter (Signed)
 Noted.

## 2023-12-23 NOTE — Telephone Encounter (Signed)
 I messaged patient that she needs an in person visit first then I will fill the medication

## 2023-12-24 ENCOUNTER — Other Ambulatory Visit (HOSPITAL_COMMUNITY): Payer: Self-pay

## 2023-12-26 ENCOUNTER — Other Ambulatory Visit (HOSPITAL_BASED_OUTPATIENT_CLINIC_OR_DEPARTMENT_OTHER): Payer: Self-pay

## 2023-12-26 ENCOUNTER — Ambulatory Visit: Payer: Medicaid Other | Admitting: Family Medicine

## 2023-12-26 VITALS — BP 98/68 | HR 125 | Temp 97.6°F | Ht 64.0 in | Wt 144.3 lb

## 2023-12-26 DIAGNOSIS — F900 Attention-deficit hyperactivity disorder, predominantly inattentive type: Secondary | ICD-10-CM | POA: Diagnosis not present

## 2023-12-26 DIAGNOSIS — Z1159 Encounter for screening for other viral diseases: Secondary | ICD-10-CM | POA: Diagnosis not present

## 2023-12-26 DIAGNOSIS — E0500 Thyrotoxicosis with diffuse goiter without thyrotoxic crisis or storm: Secondary | ICD-10-CM

## 2023-12-26 MED ORDER — METHIMAZOLE 10 MG PO TABS
20.0000 mg | ORAL_TABLET | Freq: Two times a day (BID) | ORAL | 1 refills | Status: DC
  Filled 2023-12-26: qty 360, 90d supply, fill #0

## 2023-12-26 MED ORDER — AMPHETAMINE-DEXTROAMPHET ER 30 MG PO CP24
30.0000 mg | ORAL_CAPSULE | ORAL | 0 refills | Status: DC
  Filled 2023-12-26 – 2024-01-14 (×2): qty 14, 14d supply, fill #0

## 2023-12-26 NOTE — Assessment & Plan Note (Signed)
Chronic, stable, reports her attention and focus are controlled on the current regimen of 30 mg capsule in the morning and 10 mg in the afternoon, will continue this regimen as prescribed.

## 2023-12-26 NOTE — Progress Notes (Signed)
Established Patient Office Visit  Subjective   Patient ID: Leah Perez, female    DOB: Nov 03, 1991  Age: 33 y.o. MRN: [id number]  Chief Complaint  Patient presents with   Medical Management of Chronic Issues    Pt is here for follow up on Grave's disease and ADHD medication refills.   ADHD-- pt reports she is doing well on the current dose of medication. States that she got a new job and this one is much less stressful than the previous one. She reports less anxiety today.  Graves disease -- we reviewed her labs today, she reports compliance with her methimazole, states that her anxiety has improved. Her numbers have improved somewhat, however her TSH is still too suppressed and she remains tachycardic despite using the atenolol daily. We will be increasing her methimazole to 20 mg BID and she is agreeable to the change.     Current Outpatient Medications  Medication Instructions   amphetamine-dextroamphetamine (ADDERALL XR) 30 MG 24 hr capsule 30 mg, Oral, BH-each morning   amphetamine-dextroamphetamine (ADDERALL) 10 MG tablet Take 1 tablet (10 mg) by mouth daily as needed.   atenolol (TENORMIN) 25 MG tablet Take 1 tablet (25 mg) by mouth 2 times daily.   clobetasol ointment (TEMOVATE) 0.05 % Apply to affected area 2 (two) times daily.   cyclobenzaprine (FLEXERIL) 2.5 mg, Oral, At bedtime PRN   etonogestrel (NEXPLANON) 68 MG IMPL implant 1 each,  Once   methimazole (TAPAZOLE) 20 mg, Oral, 2 times daily   sertraline (ZOLOFT) 100 MG tablet Take 1 tablet by mouth daily. (Patient needs appointment)   sertraline (ZOLOFT) 25 MG tablet Take 1 tablet (25 mg) by mouth daily (with 100mg  tablet). (Patient needs appointment)   tretinoin (RETIN-A) 0.025 % cream Topical, Daily at bedtime      Review of Systems  All other systems reviewed and are negative.     Objective:     BP 98/68   Pulse (!) 125   Temp 97.6 F (36.4 C) (Oral)   Ht 5\' 4"  (1.626 m)   Wt 144 lb 4.8 oz (65.5  kg)   SpO2 96%   BMI 24.77 kg/m    Physical Exam Vitals reviewed.  Constitutional:      Appearance: Normal appearance. She is well-groomed and normal weight.  Eyes:     Conjunctiva/sclera: Conjunctivae normal.  Neck:     Thyroid: Thyromegaly present.  Cardiovascular:     Rate and Rhythm: Normal rate and regular rhythm.     Pulses: Normal pulses.     Heart sounds: S1 normal and S2 normal.  Pulmonary:     Effort: Pulmonary effort is normal.     Breath sounds: Normal breath sounds and air entry.  Neurological:     Mental Status: She is alert and oriented to person, place, and time. Mental status is at baseline.     Gait: Gait is intact.  Psychiatric:        Mood and Affect: Mood and affect normal.        Speech: Speech normal.        Behavior: Behavior normal.        Judgment: Judgment normal.      No results found for any visits on 12/26/23.    The ASCVD Risk score (Arnett DK, et al., 2019) failed to calculate for the following reasons:   The 2019 ASCVD risk score is only valid for ages 68 to 42    Assessment & Plan:  Need for hepatitis C screening test -     Hepatitis C antibody; Future  Graves disease Assessment & Plan: Chronic, uncontrolled. TSH is still too suppressed, her numbers have improved over time, T4 is down to 9. We are increasing her methimazole to 20 mg BID and will recheck her labs in 2 months. We discussed her possibly needing a procedure to ablate the thyroid however she does not wish to do this at this time. Will see her back in 5 months for medication refills.   Orders: -     methIMAzole; Take 2 tablets (20 mg total) by mouth 2 (two) times daily.  Dispense: 360 tablet; Refill: 1 -     TSH; Future -     T4, free; Future -     T3, free; Future -     Comprehensive metabolic panel; Future -     CBC with Differential/Platelet; Future  Attention deficit hyperactivity disorder (ADHD), predominantly inattentive type Assessment & Plan: Chronic,  stable, reports her attention and focus are controlled on the current regimen of 30 mg capsule in the morning and 10 mg in the afternoon, will continue this regimen as prescribed.   Orders: -     Amphetamine-Dextroamphet ER; Take 1 capsule (30 mg total) by mouth every morning.  Dispense: 14 capsule; Refill: 0     Return in about 5 months (around 05/25/2024) for follow up on ADHD meds and thyroid.    Karie Georges, MD

## 2023-12-26 NOTE — Assessment & Plan Note (Addendum)
Chronic, uncontrolled. TSH is still too suppressed, her numbers have improved over time, T4 is down to 9. We are increasing her methimazole to 20 mg BID and will recheck her labs in 2 months. We discussed her possibly needing a procedure to ablate the thyroid however she does not wish to do this at this time. Will see her back in 5 months for medication refills.

## 2024-01-14 ENCOUNTER — Other Ambulatory Visit: Payer: Self-pay | Admitting: Family Medicine

## 2024-01-14 ENCOUNTER — Other Ambulatory Visit (HOSPITAL_BASED_OUTPATIENT_CLINIC_OR_DEPARTMENT_OTHER): Payer: Self-pay

## 2024-01-14 DIAGNOSIS — F900 Attention-deficit hyperactivity disorder, predominantly inattentive type: Secondary | ICD-10-CM

## 2024-01-14 DIAGNOSIS — L709 Acne, unspecified: Secondary | ICD-10-CM

## 2024-01-14 MED ORDER — AMPHETAMINE-DEXTROAMPHETAMINE 10 MG PO TABS
10.0000 mg | ORAL_TABLET | Freq: Every day | ORAL | 0 refills | Status: DC | PRN
  Filled 2024-01-14 – 2024-01-28 (×2): qty 30, 30d supply, fill #0

## 2024-01-14 MED ORDER — TRETINOIN 0.025 % EX CREA
TOPICAL_CREAM | Freq: Every day | CUTANEOUS | 2 refills | Status: AC
  Filled 2024-01-14: qty 45, 30d supply, fill #0

## 2024-01-28 ENCOUNTER — Other Ambulatory Visit: Payer: Self-pay | Admitting: Family Medicine

## 2024-01-28 DIAGNOSIS — F900 Attention-deficit hyperactivity disorder, predominantly inattentive type: Secondary | ICD-10-CM

## 2024-01-29 ENCOUNTER — Other Ambulatory Visit: Payer: Self-pay

## 2024-01-29 ENCOUNTER — Other Ambulatory Visit (HOSPITAL_BASED_OUTPATIENT_CLINIC_OR_DEPARTMENT_OTHER): Payer: Self-pay

## 2024-01-29 MED ORDER — AMPHETAMINE-DEXTROAMPHET ER 30 MG PO CP24
30.0000 mg | ORAL_CAPSULE | ORAL | 0 refills | Status: DC
  Filled 2024-01-29: qty 30, 30d supply, fill #0

## 2024-02-09 ENCOUNTER — Other Ambulatory Visit (HOSPITAL_BASED_OUTPATIENT_CLINIC_OR_DEPARTMENT_OTHER): Payer: Self-pay

## 2024-02-09 ENCOUNTER — Telehealth: Admitting: Family Medicine

## 2024-02-09 DIAGNOSIS — K047 Periapical abscess without sinus: Secondary | ICD-10-CM | POA: Diagnosis not present

## 2024-02-09 MED ORDER — PENICILLIN V POTASSIUM 500 MG PO TABS
500.0000 mg | ORAL_TABLET | Freq: Three times a day (TID) | ORAL | 0 refills | Status: AC
  Filled 2024-02-09: qty 21, 7d supply, fill #0

## 2024-02-09 MED ORDER — NAPROXEN 500 MG PO TABS
500.0000 mg | ORAL_TABLET | Freq: Two times a day (BID) | ORAL | 0 refills | Status: DC
  Filled 2024-02-09: qty 30, 15d supply, fill #0

## 2024-02-09 NOTE — Progress Notes (Signed)

## 2024-02-10 ENCOUNTER — Telehealth: Payer: Self-pay | Admitting: *Deleted

## 2024-02-10 ENCOUNTER — Other Ambulatory Visit (HOSPITAL_BASED_OUTPATIENT_CLINIC_OR_DEPARTMENT_OTHER): Payer: Self-pay

## 2024-02-10 ENCOUNTER — Ambulatory Visit: Admitting: Adult Health

## 2024-02-10 ENCOUNTER — Telehealth: Admitting: Family Medicine

## 2024-02-10 DIAGNOSIS — K047 Periapical abscess without sinus: Secondary | ICD-10-CM

## 2024-02-10 MED ORDER — ACETAMINOPHEN-CODEINE 300-30 MG PO TABS
1.0000 | ORAL_TABLET | Freq: Four times a day (QID) | ORAL | 0 refills | Status: DC | PRN
  Filled 2024-02-10: qty 7, 2d supply, fill #0

## 2024-02-10 NOTE — Telephone Encounter (Signed)
 Spoke with the patient and informed our office does not treat any dental/tooth problems as this is not covered by insurance when treated in a primary care office.  This was confirmed with the practice administrator and PCP was advised of this also.  Patient was informed Dr Casimiro Needle stated she will contact the patient and send in medication in this case and the visit was cancelled.  Patient requested the Rx be sent to [facility] and note was forwarded to PCP.

## 2024-02-10 NOTE — Telephone Encounter (Signed)
 Left a detailed message with the information below at the patient's cell number.

## 2024-02-10 NOTE — Telephone Encounter (Signed)
 Sent in 7 tablets of tylenol with codeine. Ok to [employer]

## 2024-02-11 ENCOUNTER — Ambulatory Visit: Admitting: Family Medicine

## 2024-02-24 ENCOUNTER — Other Ambulatory Visit: Payer: Managed Care, Other (non HMO)

## 2024-02-26 ENCOUNTER — Other Ambulatory Visit (HOSPITAL_COMMUNITY): Payer: Self-pay

## 2024-02-26 ENCOUNTER — Other Ambulatory Visit (HOSPITAL_BASED_OUTPATIENT_CLINIC_OR_DEPARTMENT_OTHER): Payer: Self-pay

## 2024-02-26 MED ORDER — HYDROCODONE-ACETAMINOPHEN 10-325 MG PO TABS
1.0000 | ORAL_TABLET | Freq: Four times a day (QID) | ORAL | 0 refills | Status: DC | PRN
Start: 2024-02-26 — End: 2024-03-04
  Filled 2024-02-26 (×2): qty 12, 3d supply, fill #0

## 2024-03-02 ENCOUNTER — Ambulatory Visit: Payer: Managed Care, Other (non HMO) | Admitting: Family Medicine

## 2024-03-04 ENCOUNTER — Other Ambulatory Visit (HOSPITAL_COMMUNITY): Payer: Self-pay

## 2024-03-04 ENCOUNTER — Other Ambulatory Visit (HOSPITAL_BASED_OUTPATIENT_CLINIC_OR_DEPARTMENT_OTHER): Payer: Self-pay

## 2024-03-04 MED ORDER — AMOXICILLIN 500 MG PO CAPS
500.0000 mg | ORAL_CAPSULE | Freq: Three times a day (TID) | ORAL | 0 refills | Status: DC
  Filled 2024-03-04 (×2): qty 15, 5d supply, fill #0

## 2024-03-04 MED ORDER — HYDROCODONE-ACETAMINOPHEN 10-325 MG PO TABS
1.0000 | ORAL_TABLET | Freq: Four times a day (QID) | ORAL | 0 refills | Status: DC | PRN
  Filled 2024-03-04 (×2): qty 6, 2d supply, fill #0

## 2024-03-16 ENCOUNTER — Other Ambulatory Visit (HOSPITAL_COMMUNITY): Payer: Self-pay

## 2024-03-17 ENCOUNTER — Other Ambulatory Visit (HOSPITAL_COMMUNITY): Payer: Self-pay

## 2024-03-19 ENCOUNTER — Other Ambulatory Visit (HOSPITAL_COMMUNITY): Payer: Self-pay

## 2024-04-04 ENCOUNTER — Other Ambulatory Visit: Payer: Self-pay | Admitting: Family Medicine

## 2024-04-04 DIAGNOSIS — F900 Attention-deficit hyperactivity disorder, predominantly inattentive type: Secondary | ICD-10-CM

## 2024-04-05 ENCOUNTER — Other Ambulatory Visit (HOSPITAL_BASED_OUTPATIENT_CLINIC_OR_DEPARTMENT_OTHER): Payer: Self-pay

## 2024-04-05 MED ORDER — AMPHETAMINE-DEXTROAMPHET ER 30 MG PO CP24
30.0000 mg | ORAL_CAPSULE | ORAL | 0 refills | Status: DC
  Filled 2024-04-05: qty 30, 30d supply, fill #0

## 2024-04-16 ENCOUNTER — Encounter: Payer: Self-pay | Admitting: Family Medicine

## 2024-04-16 ENCOUNTER — Ambulatory Visit: Payer: Self-pay

## 2024-04-16 ENCOUNTER — Ambulatory Visit (INDEPENDENT_AMBULATORY_CARE_PROVIDER_SITE_OTHER): Admitting: Family Medicine

## 2024-04-16 VITALS — BP 135/85 | HR 108 | Temp 98.8°F | Resp 12 | Ht 64.0 in | Wt 153.2 lb

## 2024-04-16 DIAGNOSIS — J029 Acute pharyngitis, unspecified: Secondary | ICD-10-CM

## 2024-04-16 DIAGNOSIS — R030 Elevated blood-pressure reading, without diagnosis of hypertension: Secondary | ICD-10-CM

## 2024-04-16 DIAGNOSIS — T189XXA Foreign body of alimentary tract, part unspecified, initial encounter: Secondary | ICD-10-CM

## 2024-04-16 DIAGNOSIS — Z1159 Encounter for screening for other viral diseases: Secondary | ICD-10-CM

## 2024-04-16 DIAGNOSIS — E0500 Thyrotoxicosis with diffuse goiter without thyrotoxic crisis or storm: Secondary | ICD-10-CM | POA: Diagnosis not present

## 2024-04-16 LAB — CBC WITH DIFFERENTIAL/PLATELET
Basophils Absolute: 0 10*3/uL (ref 0.0–0.1)
Basophils Relative: 0.4 % (ref 0.0–3.0)
Eosinophils Absolute: 0.1 10*3/uL (ref 0.0–0.7)
Eosinophils Relative: 1.9 % (ref 0.0–5.0)
HCT: 40 % (ref 36.0–46.0)
Hemoglobin: 13.4 g/dL (ref 12.0–15.0)
Lymphocytes Relative: 26.8 % (ref 12.0–46.0)
Lymphs Abs: 1.3 10*3/uL (ref 0.7–4.0)
MCHC: 33.4 g/dL (ref 30.0–36.0)
MCV: 87.2 fl (ref 78.0–100.0)
Monocytes Absolute: 0.5 10*3/uL (ref 0.1–1.0)
Monocytes Relative: 9.2 % (ref 3.0–12.0)
Neutro Abs: 3.1 10*3/uL (ref 1.4–7.7)
Neutrophils Relative %: 61.7 % (ref 43.0–77.0)
Platelets: 232 10*3/uL (ref 150.0–400.0)
RBC: 4.59 Mil/uL (ref 3.87–5.11)
RDW: 13.2 % (ref 11.5–15.5)
WBC: 5 10*3/uL (ref 4.0–10.5)

## 2024-04-16 LAB — COMPREHENSIVE METABOLIC PANEL WITH GFR
ALT: 25 U/L (ref 0–35)
AST: 15 U/L (ref 0–37)
Albumin: 4.1 g/dL (ref 3.5–5.2)
Alkaline Phosphatase: 83 U/L (ref 39–117)
BUN: 13 mg/dL (ref 6–23)
CO2: 28 meq/L (ref 19–32)
Calcium: 9.2 mg/dL (ref 8.4–10.5)
Chloride: 105 meq/L (ref 96–112)
Creatinine, Ser: 0.48 mg/dL (ref 0.40–1.20)
GFR: 124.58 mL/min (ref >60.00)
Glucose, Bld: 98 mg/dL (ref 70–99)
Potassium: 4.3 meq/L (ref 3.5–5.1)
Sodium: 140 meq/L (ref 135–145)
Total Bilirubin: 0.4 mg/dL (ref 0.2–1.2)
Total Protein: 6.9 g/dL (ref 6.0–8.3)

## 2024-04-16 LAB — T3, FREE: T3, Free: 11.3 pg/mL — ABNORMAL HIGH (ref 2.3–4.2)

## 2024-04-16 LAB — T4, FREE: Free T4: 3.56 ng/dL — ABNORMAL HIGH (ref 0.60–1.60)

## 2024-04-16 LAB — TSH: TSH: <0.01 u[IU]/mL — ABNORMAL LOW (ref 0.35–5.50)

## 2024-04-16 NOTE — Telephone Encounter (Signed)
 Copied from CRM [id number]. Topic: Clinical - Red Word Triage >> Apr 16, 2024 12:29 PM Juleen Oakland F wrote: Red Word that prompted transfer to Nurse Triage: patient swallowed piece of paper and now her thyroid  is enlarged and painful - no appts available for today   Chief Complaint: Sore throat  Symptoms: Sore throat/foreign body sensation  Frequency: Constant  Pertinent Negatives: Patient denies difficulty swallowing or breathing  Disposition: [] ED /[] Urgent Care (no appt availability in office) / [x] Appointment(In office/virtual)/ []  [facility]/ [] Home Care/ [] Refused Recommended Disposition /[] [facility]/ []  Follow-up with PCP Additional Notes: Patient reports that 2 nights ago she took Ibuprofen  without looking and believes she may have swallowed the silica packet. She states that when this happened she had some discomfort to her throat that has stayed constant since that time. She is unsure if it became stuck or if it just scratched her throat and wanted to be evaluated. Appointment made for the patein today for evaluation.     Reason for Disposition  [1] Sore throat is the only symptom AND [2] present > 48 hours  Answer Assessment - Initial Assessment Questions 1. ONSET: "When did the throat start hurting?" (Hours or days ago)      2 nights ago  2. SEVERITY: "How bad is the sore throat?" (Scale 1-10; mild, moderate or severe)   - MILD (1-3):  Doesn't interfere with eating or normal activities.   - MODERATE (4-7): Interferes with eating some solids and normal activities.   - SEVERE (8-10):  Excruciating pain, interferes with most normal activities.   - SEVERE WITH DYSPHAGIA (10): Can't swallow liquids, drooling.     Mild 3. STREP EXPOSURE: "Has there been any exposure to strep within the past week?" If Yes, ask: "What type of contact occurred?"      No 4.  VIRAL SYMPTOMS: "Are there any symptoms of a cold, such as a runny nose, cough, hoarse voice or red eyes?"       No 5. FEVER: "Do you have a fever?" If Yes, ask: "What is your temperature, how was it measured, and when did it start?"     No 6. PUS ON THE TONSILS: "Is there pus on the tonsils in the back of your throat?"     No 7. OTHER SYMPTOMS: "Do you have any other symptoms?" (e.g., difficulty breathing, headache, rash)     None  8. PREGNANCY: "Is there any chance you are pregnant?" "When was your last menstrual period?"     No  Protocols used: Sore Throat-A-AH

## 2024-04-16 NOTE — Telephone Encounter (Signed)
 Patient was scheduled for an appt today with Dr [place] at 1pm.

## 2024-04-16 NOTE — Patient Instructions (Addendum)
 A few things to remember from today's visit:  Acute sore throat - Plan: Comprehensive metabolic panel with GFR  Swallowed foreign body, initial encounter  Monitor for gastrointestinal symptoms. If any difficulty swallowing seek immediate medical attention. Increase fluid intake.  If you need refills for medications you take chronically, please call your pharmacy. Do not use My Chart to request refills or for acute issues that need immediate attention. If you send a my chart message, it may take a few days to be addressed, specially if I am not in the office.  Please be sure medication list is accurate. If a new problem present, please set up appointment sooner than planned today.

## 2024-04-16 NOTE — Progress Notes (Signed)
 ACUTE VISIT Chief Complaint  Patient presents with   Sore Throat    "Piece of paper" accidentally took with Ibuprofen  Wednesday night. Pt thinks it might still be stuck in throat.   HPI: Leah Perez is a 33 y.o. female, who is here today complaining of sore throat x 1-2 days, as described above.  Pt swallowed a piece of paper while taking Ibuprofen  ("Major brand") on Thursday 1:30 AM for a headache.  She believes it was the small strip in the bottle, though she is not exactly certain given she was in the dark and rushing.  She states she can feel something in her throat when swallowing but denies difficulty swallowing solids or liquids.  Sore Throat  This is a new problem. The current episode started yesterday. The problem has been unchanged. There has been no fever. The pain is mild. Pertinent negatives include no abdominal pain, congestion, coughing, diarrhea, drooling, ear discharge, ear pain, hoarse voice, shortness of breath, stridor, swollen glands or vomiting. She has had no exposure to strep or mono.   She endorses chest tightness, which she attributes to anxiety related to this situation.  Additionally, she states her thyroid  has been enlarged, ongoing for 3 years.  Denies any fever, chills, oral lesions, dysphagia, cough, wheezing, nausea, vomiting, abdominal pain, constipation, diarrhea,or urinary symptoms.  BP elevated today, reports hx of HTN but not listed on PMHx. She is on Atenolol  25 mg bid.  Hx of hyperthyroidism and anxiety.  Review of Systems  Constitutional:  Negative for activity change and appetite change.  HENT:  Negative for congestion, drooling, ear discharge, ear pain and hoarse voice.   Respiratory:  Negative for cough, shortness of breath and stridor.   Gastrointestinal:  Negative for abdominal pain, diarrhea and vomiting.  Genitourinary:  Negative for decreased urine volume, dysuria and hematuria.  Musculoskeletal:  Negative for gait problem  and myalgias.  Skin:  Negative for rash.  Neurological:  Negative for syncope, facial asymmetry and weakness.  Psychiatric/Behavioral:  Negative for confusion and hallucinations. The patient is nervous/anxious.   See other pertinent positives and negatives in HPI.  Current Outpatient Medications on File Prior to Visit  Medication Sig Dispense Refill   acetaminophen -codeine  (TYLENOL  #3) 300-30 MG tablet Take 1 tablet by mouth every 6 (six) hours as needed for moderate pain (pain score 4-6). 7 tablet 0   amoxicillin  (AMOXIL ) 500 MG capsule Take 1 capsule (500 mg total) by mouth 3 (three) times daily until gone. 15 capsule 0   amphetamine -dextroamphetamine  (ADDERALL  XR) 30 MG 24 hr capsule Take 1 capsule (30 mg total) by mouth every morning. 30 capsule 0   amphetamine -dextroamphetamine  (ADDERALL ) 10 MG tablet Take 1 tablet (10 mg) by mouth daily as needed. 30 tablet 0   atenolol  (TENORMIN ) 25 MG tablet Take 1 tablet (25 mg) by mouth 2 times daily. 180 tablet 0   clobetasol  ointment (TEMOVATE ) 0.05 % Apply to affected area 2 (two) times daily. 30 g 0   cyclobenzaprine  (FLEXERIL ) 5 MG tablet Take 0.5 tablets (2.5 mg total) by mouth at bedtime as needed for muscle spasms. 10 tablet 0   etonogestrel  (NEXPLANON ) 68 MG IMPL implant 1 each by Subdermal route once.     HYDROcodone -acetaminophen  (NORCO) 10-325 MG tablet Take 1 tablet by mouth every 6 (six) hours as needed for pain. 6 tablet 0   methimazole  (TAPAZOLE ) 10 MG tablet Take 2 tablets (20 mg total) by mouth 2 (two) times daily. 360 tablet 1  naproxen  (NAPROSYN ) 500 MG tablet Take 1 tablet (500 mg total) by mouth 2 (two) times daily with a meal. 30 tablet 0   sertraline  (ZOLOFT ) 100 MG tablet Take 1 tablet by mouth daily. (Patient needs appointment) 90 tablet 3   sertraline  (ZOLOFT ) 25 MG tablet Take 1 tablet (25 mg) by mouth daily (with 100mg  tablet). (Patient needs appointment) 90 tablet 0   tretinoin  (RETIN-A ) 0.025 % cream Apply topically at  bedtime. 45 g 2   No current facility-administered medications on file prior to visit.   Past Medical History:  Diagnosis Date   ANXIETY 03/06/2009   DEPRESSION 03/06/2009   Depression    Migraine    NSVD (normal spontaneous vaginal delivery) 09/12/2015   PROM (premature rupture of membranes) 09/09/2015   Rh negative status during pregnancy in second trimester, antepartum 05/31/2015   [ ] Rhophylac     Thyroid  disease    Allergies  Allergen Reactions   Doxycycline Hives and Rash    Social History   Socioeconomic History   Marital status: Single    Spouse name: Not on file   Number of children: 1   Years of education: Not on file   Highest education level: Associate degree: academic program  Occupational History   Occupation: Works in affordable housing  Tobacco Use   Smoking status: Every Day    Current packs/day: 0.00    Average packs/day: 0.3 packs/day for 10.0 years (2.5 ttl pk-yrs)    Types: Cigarettes    Start date: 12/09/2001    Last attempt to quit: 12/10/2011    Years since quitting: 12.3   Smokeless tobacco: Never   Tobacco comments:    on occasion  Substance and Sexual Activity   Alcohol use: Yes    Alcohol/week: 1.0 standard drink of alcohol    Types: 1 Glasses of wine per week    Comment: occ   Drug use: No   Sexual activity: Yes    Birth control/protection: Implant  Other Topics Concern   Not on file  Social History Narrative   ** Merged History Encounter **       Social Drivers of Health   Financial Resource Strain: Patient Declined (12/26/2023)   Overall Financial Resource Strain (CARDIA)    Difficulty of Paying Living Expenses: Patient declined  Food Insecurity: Patient Declined (12/26/2023)   Hunger Vital Sign    Worried About Running Out of Food in the Last Year: Patient declined    Ran Out of Food in the Last Year: Patient declined  Transportation Needs: No Transportation Needs (12/26/2023)   PRAPARE - Scientist, research (physical sciences) (Medical): No    Lack of Transportation (Non-Medical): No  Physical Activity: Sufficiently Active (12/26/2023)   Exercise Vital Sign    Days of Exercise per Week: 4 days    Minutes of Exercise per Session: 60 min  Stress: Stress Concern Present (12/26/2023)   [employer] of Occupational Health - Occupational Stress Questionnaire    Feeling of Stress : To some extent  Social Connections: Socially Isolated (12/26/2023)   Social Connection and Isolation Panel [NHANES]    Frequency of Communication with Friends and Family: More than three times a week    Frequency of Social Gatherings with Friends and Family: Once a week    Attends Religious Services: Never    Database administrator or Organizations: No    Attends Engineer, structural: Not on file    Marital Status: Never married  Vitals:   04/16/24 1308 04/16/24 1332  BP: (!) 142/78 135/85  Pulse: (!) 123 (!) 108  Resp: 12   Temp: 98.8 F (37.1 C)   SpO2: 98%    Body mass index is 26.3 kg/m.  Physical Exam Vitals and nursing note reviewed.  Constitutional:      General: She is not in acute distress.    Appearance: She is well-developed.  HENT:     Head: Normocephalic and atraumatic.     Mouth/Throat:     Mouth: Mucous membranes are moist.     Pharynx: Oropharynx is clear. Uvula midline. Postnasal drip present. No oropharyngeal exudate, posterior oropharyngeal erythema or uvula swelling.     Tonsils: No tonsillar exudate.  Eyes:     Conjunctiva/sclera: Conjunctivae normal.  Cardiovascular:     Rate and Rhythm: Regular rhythm. Tachycardia present.     Heart sounds: No murmur heard. Pulmonary:     Effort: Pulmonary effort is normal. No respiratory distress.     Breath sounds: Normal breath sounds. No stridor.  Abdominal:     Palpations: Abdomen is soft. There is no mass.     Tenderness: There is no abdominal tenderness.  Musculoskeletal:     Right lower leg: No edema.     Left lower leg: No  edema.  Lymphadenopathy:     Cervical: No cervical adenopathy.  Skin:    General: Skin is warm.     Findings: No erythema or rash.  Neurological:     General: No focal deficit present.     Mental Status: She is alert and oriented to person, place, and time.     Gait: Gait normal.  Psychiatric:        Mood and Affect: Affect normal. Mood is anxious.   ASSESSMENT AND PLAN: Leah Perez was seen today for sore throat.  Lab Results  Component Value Date   NA 140 04/16/2024   CL 105 04/16/2024   K 4.3 04/16/2024   CO2 28 04/16/2024   BUN 13 04/16/2024   CREATININE 0.48 04/16/2024   GFR 124.58 04/16/2024   CALCIUM 9.2 04/16/2024   ALBUMIN 4.1 04/16/2024   GLUCOSE 98 04/16/2024   Lab Results  Component Value Date   ALT 25 04/16/2024   AST 15 04/16/2024   ALKPHOS 83 04/16/2024   BILITOT 0.4 04/16/2024   Acute sore throat Could be irritation from mild trauma when swallowing tab or foreign body On examination I did not appreciate any foreign body or oropharyngeal abnormalities. Clearly instructed about warning signs.  -     Comprehensive metabolic panel with GFR; Future  Swallowed foreign body, initial encounter She thinks she may have swallowed a piece of paper that was in her Ibuprofen  bottle to keep medication dry. She is not sure because it was dark when she took Ibuprofen , felt something scratching her throat and still feels something when she swallows.   I do not think she needs to go to the ER unless she starts with difficulty swallowing,wheezing, or stridor among some. Clearly instructed about warning signs.  Elevated blood pressure reading Improved after a few minutes but still above goal. Continue atenolol  25 mg twice daily. Instructed to monitor BP and HR at home. Continue to follow with PCP.  Return if symptoms worsen or fail to improve, for keep next appointment.  I, Bernita Bristle, acting as a scribe for Sandee Bernath [place], MD., have documented all  relevant documentation on the behalf of Roxy Mastandrea [place], MD, as directed  by   while in the presence of Dany Walther [place], MD.  I, Keaghan Staton [place], MD, have reviewed all documentation for this visit. The documentation on 04/16/24 for the exam, diagnosis, procedures, and orders are all accurate and complete.  Joselynn Amoroso G. [place], MD  [facility]. [place] [facility].

## 2024-04-17 LAB — HEPATITIS C ANTIBODY: Hepatitis C Ab: NONREACTIVE

## 2024-04-18 NOTE — Progress Notes (Signed)
 TSH is worse than previous, please ask the pt if she is taking the methimazole  as prescribed,if she is then she really needs to go back to the endocrinologist to manage this problem.

## 2024-04-18 NOTE — Telephone Encounter (Signed)
 Noted- ok to close.

## 2024-04-21 ENCOUNTER — Ambulatory Visit: Payer: Self-pay | Admitting: *Deleted

## 2024-04-21 NOTE — Progress Notes (Signed)
 Is she willing to go back to see Dr. Aldona Amel? Or maybe a different endocrinologist?? They might be able to give her more definitive treatment so she won't have to take the medication anymore.Aaron AasAaron Aas

## 2024-07-14 ENCOUNTER — Encounter: Payer: Self-pay | Admitting: Family Medicine

## 2024-08-25 ENCOUNTER — Ambulatory Visit: Payer: Self-pay | Admitting: Family Medicine

## 2024-09-06 ENCOUNTER — Other Ambulatory Visit: Payer: Self-pay

## 2024-09-06 ENCOUNTER — Other Ambulatory Visit (HOSPITAL_BASED_OUTPATIENT_CLINIC_OR_DEPARTMENT_OTHER): Payer: Self-pay

## 2024-09-06 ENCOUNTER — Ambulatory Visit (INDEPENDENT_AMBULATORY_CARE_PROVIDER_SITE_OTHER): Payer: Self-pay | Admitting: Family Medicine

## 2024-09-06 ENCOUNTER — Encounter: Payer: Self-pay | Admitting: Family Medicine

## 2024-09-06 DIAGNOSIS — F411 Generalized anxiety disorder: Secondary | ICD-10-CM

## 2024-09-06 DIAGNOSIS — E0500 Thyrotoxicosis with diffuse goiter without thyrotoxic crisis or storm: Secondary | ICD-10-CM

## 2024-09-06 DIAGNOSIS — F900 Attention-deficit hyperactivity disorder, predominantly inattentive type: Secondary | ICD-10-CM

## 2024-09-06 MED ORDER — AMPHETAMINE-DEXTROAMPHET ER 30 MG PO CP24
30.0000 mg | ORAL_CAPSULE | ORAL | 0 refills | Status: DC
  Filled 2024-09-06: qty 30, 30d supply, fill #0

## 2024-09-06 MED ORDER — AMPHETAMINE-DEXTROAMPHET ER 30 MG PO CP24
30.0000 mg | ORAL_CAPSULE | ORAL | 0 refills | Status: DC
Start: 2024-10-04 — End: 2024-10-19
  Filled 2024-10-18: qty 30, 30d supply, fill #0

## 2024-09-06 MED ORDER — SERTRALINE HCL 100 MG PO TABS
100.0000 mg | ORAL_TABLET | Freq: Every day | ORAL | 3 refills | Status: DC
  Filled 2024-09-06: qty 90, 90d supply, fill #0

## 2024-09-06 MED ORDER — AMPHETAMINE-DEXTROAMPHET ER 30 MG PO CP24
30.0000 mg | ORAL_CAPSULE | ORAL | 0 refills | Status: DC
  Filled 2024-11-23: qty 30, 30d supply, fill #0

## 2024-09-06 MED ORDER — AMPHETAMINE-DEXTROAMPHETAMINE 10 MG PO TABS
10.0000 mg | ORAL_TABLET | Freq: Every day | ORAL | 0 refills | Status: DC | PRN
  Filled 2024-09-06 – 2024-10-18 (×2): qty 30, 30d supply, fill #0

## 2024-09-06 MED ORDER — METHIMAZOLE 10 MG PO TABS
20.0000 mg | ORAL_TABLET | Freq: Two times a day (BID) | ORAL | 1 refills | Status: DC
  Filled 2024-09-06: qty 360, 90d supply, fill #0

## 2024-09-06 MED ORDER — ATENOLOL 25 MG PO TABS
25.0000 mg | ORAL_TABLET | Freq: Two times a day (BID) | ORAL | 1 refills | Status: DC
  Filled 2024-09-06: qty 180, 90d supply, fill #0

## 2024-09-06 NOTE — Progress Notes (Unsigned)
 Established Patient Office Visit  Subjective   Patient ID: Leah Perez, female    DOB: 1991-05-08  Age: 33 y.o. MRN: [id number]  Chief Complaint  Patient presents with  . Medical Management of Chronic Issues    HPI   Current Outpatient Medications  Medication Instructions  . amphetamine -dextroamphetamine  (ADDERALL  XR) 30 MG 24 hr capsule 30 mg, Oral, BH-each morning  . [START ON 10/04/2024] amphetamine -dextroamphetamine  (ADDERALL  XR) 30 MG 24 hr capsule 30 mg, Oral, BH-each morning  . [START ON 11/01/2024] amphetamine -dextroamphetamine  (ADDERALL  XR) 30 MG 24 hr capsule 30 mg, Oral, BH-each morning  . amphetamine -dextroamphetamine  (ADDERALL ) 10 MG tablet Take 1 tablet (10 mg) by mouth daily as needed.  . atenolol  (TENORMIN ) 25 MG tablet Take 1 tablet (25 mg) by mouth 2 times daily.  . etonogestrel  (NEXPLANON ) 68 MG IMPL implant 1 each,  Once  . methimazole  (TAPAZOLE ) 20 mg, Oral, 2 times daily  . sertraline  (ZOLOFT ) 100 MG tablet Take 1 tablet by mouth daily. (Patient needs appointment)  . tretinoin  (RETIN-A ) 0.025 % cream Topical, Daily at bedtime    Patient Active Problem List   Diagnosis Date Noted  . Graves disease 05/22/2023  . 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 Hyperthyroidism 05/25/2021  . ADHD (attention deficit hyperactivity disorder) 10/08/2012  . History of migraine headaches 08/07/2011  . Anxiety state 03/06/2009  . DEPRESSION 03/06/2009     Review of Systems  All other systems reviewed and are negative.     Objective:     BP (!) 145/75 (BP Location: Right Arm, Patient Position: Sitting, Cuff Size: Normal)   Pulse 94   Temp 98 F (36.7 C) (Oral)   Ht 5' 4 (1.626 m)   Wt 159 lb 6.4 oz (72.3 kg)   SpO2 95%   BMI 27.36 kg/m  {Vitals History (Optional):23777}  Physical Exam Vitals reviewed.  Constitutional:      Appearance: Normal appearance. She is well-groomed and normal weight.  Eyes:      Conjunctiva/sclera: Conjunctivae normal.  Neck:     Thyroid : No thyromegaly.  Cardiovascular:     Rate and Rhythm: Normal rate and regular rhythm.     Pulses: Normal pulses.     Heart sounds: S1 normal and S2 normal.  Pulmonary:     Effort: Pulmonary effort is normal.     Breath sounds: Normal breath sounds and air entry.  Abdominal:     General: Bowel sounds are normal.  Musculoskeletal:     Right lower leg: No edema.     Left lower leg: No edema.  Neurological:     Mental Status: She is alert and oriented to person, place, and time. Mental status is at baseline.     Gait: Gait is intact.  Psychiatric:        Mood and Affect: Mood and affect normal.        Speech: Speech normal.        Behavior: Behavior normal.        Judgment: Judgment normal.      No results found for any visits on 09/06/24.  {Labs (Optional):23779}  The ASCVD Risk score (Arnett DK, et al., 2019) failed to calculate for the following reasons:   The 2019 ASCVD risk score is only valid for ages 30 to 17    Assessment & Plan:  Graves disease -     methIMAzole ; Take 2 tablets (20 mg total) by mouth 2 (two) times daily.  Dispense: 360  tablet; Refill: 1 -     Atenolol ; Take 1 tablet (25 mg) by mouth 2 times daily.  Dispense: 180 tablet; Refill: 1  Anxiety state -     Sertraline  HCl; Take 1 tablet by mouth daily. (Patient needs appointment)  Dispense: 90 tablet; Refill: 3  Attention deficit hyperactivity disorder (ADHD), predominantly inattentive type -     Amphetamine -Dextroamphetamine ; Take 1 tablet (10 mg) by mouth daily as needed.  Dispense: 30 tablet; Refill: 0 -     Amphetamine -Dextroamphet ER; Take 1 capsule (30 mg total) by mouth every morning.  Dispense: 30 capsule; Refill: 0 -     Amphetamine -Dextroamphet ER; Take 1 capsule (30 mg total) by mouth every morning.  Dispense: 30 capsule; Refill: 0 -     Amphetamine -Dextroamphet ER; Take 1 capsule (30 mg total) by mouth every morning.  Dispense: 30  capsule; Refill: 0     Return in about 3 months (around 12/06/2024).    Leah Perez CHRISTELLA Sharper, MD

## 2024-09-07 ENCOUNTER — Other Ambulatory Visit (HOSPITAL_BASED_OUTPATIENT_CLINIC_OR_DEPARTMENT_OTHER): Payer: Self-pay

## 2024-09-07 ENCOUNTER — Encounter: Payer: Self-pay | Admitting: Family Medicine

## 2024-09-07 ENCOUNTER — Other Ambulatory Visit: Payer: Self-pay

## 2024-09-07 DIAGNOSIS — E0500 Thyrotoxicosis with diffuse goiter without thyrotoxic crisis or storm: Secondary | ICD-10-CM

## 2024-09-07 DIAGNOSIS — F900 Attention-deficit hyperactivity disorder, predominantly inattentive type: Secondary | ICD-10-CM

## 2024-09-07 DIAGNOSIS — F411 Generalized anxiety disorder: Secondary | ICD-10-CM

## 2024-09-07 MED ORDER — METHIMAZOLE 10 MG PO TABS: 20.0000 mg | ORAL_TABLET | Freq: Two times a day (BID) | ORAL | 1 refills | Status: DC

## 2024-09-07 MED ORDER — SERTRALINE HCL 100 MG PO TABS: 100.0000 mg | ORAL_TABLET | Freq: Every day | ORAL | 3 refills | Status: AC

## 2024-09-07 MED ORDER — AMPHETAMINE-DEXTROAMPHET ER 30 MG PO CP24: 30.0000 mg | ORAL_CAPSULE | ORAL | 0 refills | Status: DC

## 2024-09-07 MED ORDER — ATENOLOL 25 MG PO TABS: 25.0000 mg | ORAL_TABLET | Freq: Two times a day (BID) | ORAL | 1 refills | Status: DC

## 2024-09-07 NOTE — Assessment & Plan Note (Signed)
 Anxiety was previously controlled, will restart her medication at 100 mg daily

## 2024-09-07 NOTE — Assessment & Plan Note (Signed)
 Will restart her adderall  scripts, I think that if her hyperthyroidism is better controlled that her anxiety and ADHD would also improve, as this is greatly impacting her on a hormonal level. RTC in 3 months for repeat labs and med refills

## 2024-09-07 NOTE — Assessment & Plan Note (Signed)
 Chronically uncontrolled, she is off her medication currently so will get it restarted for her, she has been unable to follow up with the endcrinoogist due to coverage issues. Will continue the medications listed below. Will recheck her labs after about 3 months.

## 2024-09-17 ENCOUNTER — Other Ambulatory Visit: Payer: Self-pay | Admitting: Medical Genetics

## 2024-09-17 ENCOUNTER — Other Ambulatory Visit (HOSPITAL_BASED_OUTPATIENT_CLINIC_OR_DEPARTMENT_OTHER): Payer: Self-pay

## 2024-09-17 DIAGNOSIS — Z006 Encounter for examination for normal comparison and control in clinical research program: Secondary | ICD-10-CM

## 2024-09-17 NOTE — Addendum Note (Signed)
 Addended by: ANITRA BLACKBIRD E on: 09/17/2024 10:49 AM   Modules accepted: Orders

## 2024-09-29 ENCOUNTER — Other Ambulatory Visit: Payer: Self-pay

## 2024-10-18 ENCOUNTER — Other Ambulatory Visit (HOSPITAL_BASED_OUTPATIENT_CLINIC_OR_DEPARTMENT_OTHER): Payer: Self-pay

## 2024-10-18 ENCOUNTER — Other Ambulatory Visit: Payer: Self-pay

## 2024-10-18 ENCOUNTER — Encounter: Payer: Self-pay | Admitting: Family Medicine

## 2024-10-18 DIAGNOSIS — F900 Attention-deficit hyperactivity disorder, predominantly inattentive type: Secondary | ICD-10-CM

## 2024-10-19 ENCOUNTER — Other Ambulatory Visit: Payer: Self-pay

## 2024-10-19 MED ORDER — AMPHETAMINE-DEXTROAMPHETAMINE 10 MG PO TABS: 10.0000 mg | ORAL_TABLET | Freq: Every day | ORAL | 0 refills | Status: DC | PRN

## 2024-10-19 MED ORDER — AMPHETAMINE-DEXTROAMPHET ER 30 MG PO CP24: 30.0000 mg | ORAL_CAPSULE | ORAL | 0 refills | Status: DC

## 2024-10-20 ENCOUNTER — Other Ambulatory Visit (HOSPITAL_BASED_OUTPATIENT_CLINIC_OR_DEPARTMENT_OTHER): Payer: Self-pay

## 2024-11-23 ENCOUNTER — Other Ambulatory Visit: Payer: Self-pay

## 2024-11-23 ENCOUNTER — Other Ambulatory Visit (HOSPITAL_BASED_OUTPATIENT_CLINIC_OR_DEPARTMENT_OTHER): Payer: Self-pay

## 2024-11-23 ENCOUNTER — Encounter: Payer: Self-pay | Admitting: Family Medicine

## 2024-11-23 DIAGNOSIS — F900 Attention-deficit hyperactivity disorder, predominantly inattentive type: Secondary | ICD-10-CM

## 2024-11-23 MED ORDER — AMPHETAMINE-DEXTROAMPHET ER 30 MG PO CP24: 30.0000 mg | ORAL_CAPSULE | ORAL | 0 refills | Status: DC

## 2024-11-23 MED ORDER — AMPHETAMINE-DEXTROAMPHETAMINE 10 MG PO TABS: 10.0000 mg | ORAL_TABLET | Freq: Every day | ORAL | 0 refills | Status: DC | PRN

## 2024-12-04 ENCOUNTER — Other Ambulatory Visit (HOSPITAL_BASED_OUTPATIENT_CLINIC_OR_DEPARTMENT_OTHER): Payer: Self-pay

## 2024-12-06 ENCOUNTER — Ambulatory Visit: Payer: Self-pay | Admitting: Family Medicine

## 2024-12-28 ENCOUNTER — Ambulatory Visit (INDEPENDENT_AMBULATORY_CARE_PROVIDER_SITE_OTHER): Payer: Self-pay | Admitting: Family Medicine

## 2024-12-28 ENCOUNTER — Encounter: Payer: Self-pay | Admitting: Family Medicine

## 2024-12-28 VITALS — BP 110/78 | HR 100 | Temp 97.9°F | Ht 64.0 in | Wt 154.1 lb

## 2024-12-28 DIAGNOSIS — E0500 Thyrotoxicosis with diffuse goiter without thyrotoxic crisis or storm: Secondary | ICD-10-CM

## 2024-12-28 DIAGNOSIS — F900 Attention-deficit hyperactivity disorder, predominantly inattentive type: Secondary | ICD-10-CM

## 2024-12-28 MED ORDER — AMPHETAMINE-DEXTROAMPHET ER 30 MG PO CP24: 30.0000 mg | ORAL_CAPSULE | ORAL | 0 refills | Status: AC

## 2024-12-28 MED ORDER — METHIMAZOLE 10 MG PO TABS: 20.0000 mg | ORAL_TABLET | Freq: Two times a day (BID) | ORAL | 1 refills | Status: AC

## 2024-12-28 MED ORDER — AMPHETAMINE-DEXTROAMPHETAMINE 10 MG PO TABS: 10.0000 mg | ORAL_TABLET | Freq: Every day | ORAL | 0 refills | Status: AC | PRN

## 2024-12-28 MED ORDER — ATENOLOL 25 MG PO TABS: 25.0000 mg | ORAL_TABLET | Freq: Two times a day (BID) | ORAL | 1 refills | Status: AC

## 2024-12-28 NOTE — Progress Notes (Unsigned)
 "  Established Patient Office Visit  Subjective   Patient ID: Leah Perez, female    DOB: June 08, 1991  Age: 34 y.o. MRN: [id number]  Chief Complaint  Patient presents with   Medical Management of Chronic Issues    HPI Discussed the use of AI scribe software for clinical note transcription with the patient, who gave verbal consent to proceed.  History of Present Illness   Leah Perez is a 34 year old female with Graves' disease who presents for medication refills and lab work.  She takes methimazole  for Graves' disease and finds it effective. She also takes atenolol , which has improved her blood pressure and heart rate, which are no longer in the 130s.  She is due for thyroid  labs (TSH, free T3, free T4) but has not had recent testing due to lack of insurance. She plans to obtain labs soon and is concerned about cost and exploring cash payment options.  She takes Adderall  30 mg in the morning and 10 mg in the afternoon for attention and focus while in school full-time and working two jobs. She feels tired, which she attributes to her schedule.  She is concerned about possible low thyroid  function and notes feeling colder than she prefers, which she relates to her home environment and worries may reflect hypothyroid symptoms.       Current Outpatient Medications  Medication Instructions   [START ON 02/21/2025] amphetamine -dextroamphetamine  (ADDERALL  XR) 30 MG 24 hr capsule 30 mg, Oral, BH-each morning   [START ON 01/24/2025] amphetamine -dextroamphetamine  (ADDERALL  XR) 30 MG 24 hr capsule 30 mg, Oral, BH-each morning   amphetamine -dextroamphetamine  (ADDERALL  XR) 30 MG 24 hr capsule 30 mg, Oral, BH-each morning   amphetamine -dextroamphetamine  (ADDERALL ) 10 MG tablet Take 1 tablet (10 mg) by mouth daily as needed.   atenolol  (TENORMIN ) 25 MG tablet Take 1 tablet (25 mg) by mouth 2 times daily.   etonogestrel  (NEXPLANON ) 68 MG IMPL implant 1 each,  Once   methimazole  (TAPAZOLE )  20 mg, Oral, 2 times daily   sertraline  (ZOLOFT ) 100 MG tablet Take 1 tablet by mouth daily. 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     Objective:     BP 110/78   Pulse 100   Temp 97.9 F (36.6 C) (Oral)   Ht 5' 4 (1.626 m)   Wt 154 lb 1.6 oz (69.9 kg)   SpO2 98%   BMI 26.45 kg/m  {Vitals History (Optional):23777}  Physical Exam Vitals reviewed.  Constitutional:      Appearance: Normal appearance. She is well-groomed and normal weight.  Eyes:     Conjunctiva/sclera: Conjunctivae normal.  Neck:     Thyroid : Thyromegaly present.  Cardiovascular:     Rate and Rhythm: Normal rate and regular rhythm.     Pulses: Normal pulses.     Heart sounds: S1 normal and S2 normal.  Pulmonary:     Effort: Pulmonary effort is normal.     Breath sounds: Normal breath sounds and air entry.  Neurological:     Mental Status: She is  alert and oriented to person, place, and time. Mental status is at baseline.     Gait: Gait is intact.  Psychiatric:        Mood and Affect: Mood and affect normal.        Speech: Speech normal.        Behavior: Behavior normal.        Judgment: Judgment normal.      No results found for any visits on 12/28/24.  {Labs (Optional):23779}  The ASCVD Risk score (Arnett DK, et al., 2019) failed to calculate for the following reasons:   The 2019 ASCVD risk score is only valid for ages 64 to 15    Assessment & Plan:  Graves disease -     TSH; Future -     T3, free; Future -     T4, free;  Future -     methIMAzole ; Take 2 tablets (20 mg total) by mouth 2 (two) times daily.  Dispense: 360 tablet; Refill: 1 -     Atenolol ; Take 1 tablet (25 mg) by mouth 2 times daily.  Dispense: 180 tablet; Refill: 1  Attention deficit hyperactivity disorder (ADHD), predominantly inattentive type -     Amphetamine -Dextroamphet ER; Take 1 capsule (30 mg total) by mouth every morning.  Dispense: 30 capsule; Refill: 0 -     Amphetamine -Dextroamphetamine ; Take 1 tablet (10 mg) by mouth daily as needed.  Dispense: 30 tablet; Refill: 0 -     Amphetamine -Dextroamphet ER; Take 1 capsule (30 mg total) by mouth every morning.  Dispense: 30 capsule; Refill: 0 -     Amphetamine -Dextroamphet ER; Take 1 capsule (30 mg total) by mouth every morning.  Dispense: 30 capsule; Refill: 0     Return in about 3 months (around 03/28/2025) for ADHD med refills.    Heron CHRISTELLA Sharper, MD "
